# Patient Record
Sex: Female | Born: 1975 | Race: White | Hispanic: No | Marital: Married | Smoking: Former smoker
Health system: Southern US, Community
[De-identification: ages and names within clinical notes are randomized; demographics above are authoritative.]

## PROBLEM LIST (undated history)

## (undated) DIAGNOSIS — K219 Gastro-esophageal reflux disease without esophagitis: Secondary | ICD-10-CM

## (undated) DIAGNOSIS — F32A Depression, unspecified: Secondary | ICD-10-CM

## (undated) DIAGNOSIS — E669 Obesity, unspecified: Secondary | ICD-10-CM

## (undated) DIAGNOSIS — F329 Major depressive disorder, single episode, unspecified: Secondary | ICD-10-CM

## (undated) HISTORY — PX: CHOLECYSTECTOMY: SHX55

---

## 1997-11-23 ENCOUNTER — Other Ambulatory Visit: Admission: RE | Admit: 1997-11-23 | Discharge: 1997-11-23 | Payer: Self-pay | Admitting: Gynecology

## 1999-07-09 ENCOUNTER — Other Ambulatory Visit: Admission: RE | Admit: 1999-07-09 | Discharge: 1999-07-09 | Payer: Self-pay | Admitting: Obstetrics and Gynecology

## 1999-08-27 ENCOUNTER — Encounter: Admission: RE | Admit: 1999-08-27 | Discharge: 1999-11-25 | Payer: Self-pay | Admitting: Obstetrics and Gynecology

## 2000-01-08 ENCOUNTER — Inpatient Hospital Stay (HOSPITAL_COMMUNITY): Admission: AD | Admit: 2000-01-08 | Discharge: 2000-01-08 | Payer: Self-pay | Admitting: Obstetrics and Gynecology

## 2000-01-17 ENCOUNTER — Inpatient Hospital Stay (HOSPITAL_COMMUNITY): Admission: AD | Admit: 2000-01-17 | Discharge: 2000-01-17 | Payer: Self-pay | Admitting: Obstetrics and Gynecology

## 2000-01-21 ENCOUNTER — Inpatient Hospital Stay (HOSPITAL_COMMUNITY): Admission: AD | Admit: 2000-01-21 | Discharge: 2000-01-23 | Payer: Self-pay | Admitting: Obstetrics & Gynecology

## 2000-04-01 ENCOUNTER — Other Ambulatory Visit: Admission: RE | Admit: 2000-04-01 | Discharge: 2000-04-01 | Payer: Self-pay | Admitting: Obstetrics and Gynecology

## 2000-08-24 ENCOUNTER — Encounter: Payer: Self-pay | Admitting: Internal Medicine

## 2000-08-24 ENCOUNTER — Encounter: Admission: RE | Admit: 2000-08-24 | Discharge: 2000-08-24 | Payer: Self-pay | Admitting: Internal Medicine

## 2001-02-02 ENCOUNTER — Emergency Department (HOSPITAL_COMMUNITY): Admission: EM | Admit: 2001-02-02 | Discharge: 2001-02-02 | Payer: Self-pay | Admitting: Emergency Medicine

## 2001-02-02 ENCOUNTER — Encounter: Payer: Self-pay | Admitting: Emergency Medicine

## 2001-07-06 ENCOUNTER — Emergency Department (HOSPITAL_COMMUNITY): Admission: EM | Admit: 2001-07-06 | Discharge: 2001-07-06 | Payer: Self-pay | Admitting: Emergency Medicine

## 2001-07-18 ENCOUNTER — Encounter: Payer: Self-pay | Admitting: Internal Medicine

## 2001-07-18 ENCOUNTER — Encounter: Admission: RE | Admit: 2001-07-18 | Discharge: 2001-07-18 | Payer: Self-pay | Admitting: Internal Medicine

## 2001-12-20 ENCOUNTER — Inpatient Hospital Stay (HOSPITAL_COMMUNITY): Admission: EM | Admit: 2001-12-20 | Discharge: 2001-12-21 | Payer: Self-pay | Admitting: Emergency Medicine

## 2001-12-20 ENCOUNTER — Encounter: Payer: Self-pay | Admitting: Emergency Medicine

## 2007-01-10 ENCOUNTER — Inpatient Hospital Stay (HOSPITAL_COMMUNITY): Admission: AD | Admit: 2007-01-10 | Discharge: 2007-01-10 | Payer: Self-pay | Admitting: Obstetrics & Gynecology

## 2007-02-04 ENCOUNTER — Inpatient Hospital Stay (HOSPITAL_COMMUNITY): Admission: AD | Admit: 2007-02-04 | Discharge: 2007-02-05 | Payer: Self-pay | Admitting: Obstetrics

## 2007-02-23 ENCOUNTER — Encounter: Admission: RE | Admit: 2007-02-23 | Discharge: 2007-02-23 | Payer: Self-pay | Admitting: Internal Medicine

## 2007-03-09 ENCOUNTER — Ambulatory Visit (HOSPITAL_COMMUNITY): Admission: RE | Admit: 2007-03-09 | Discharge: 2007-03-10 | Payer: Self-pay | Admitting: Surgery

## 2007-03-09 ENCOUNTER — Encounter (INDEPENDENT_AMBULATORY_CARE_PROVIDER_SITE_OTHER): Payer: Self-pay | Admitting: Surgery

## 2007-04-24 ENCOUNTER — Emergency Department (HOSPITAL_COMMUNITY): Admission: EM | Admit: 2007-04-24 | Discharge: 2007-04-24 | Payer: Self-pay | Admitting: Emergency Medicine

## 2010-09-09 NOTE — Op Note (Signed)
Bethany Alvarado, Bethany Alvarado             ACCOUNT NO.:  [id number]   MEDICAL RECORD NO.:  [id number]          PATIENT TYPE:  OIB   LOCATION:  1417                         FACILITY:  [facility]   PHYSICIAN:  Wilmon Arms. Corliss Skains, M.D. 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   POSTOPERATIVE DIAGNOSIS:  Symptomatic cholelithiasis and chronic  calculus cholecystitis.   PROCEDURE PERFORMED:  Laparoscopic cholecystectomy with intraoperative  cholangiogram.   SURGEON:  Wilmon Arms. Tsuei, M.D.   ANESTHESIA:  General endotracheal.   INDICATIONS:  The patient is a 35 year old female who is 1 month  postpartum.  She had a single attack of severe right upper quadrant  pain, nausea and vomiting.  The pain was located in the epigastrium and  right upper quadrant radiating through her back and right shoulder.  White count was normal.  Liver function tests were normal.  Ultrasound  showed cholelithiasis but no sign of cholecystitis.  She had positive  HIDA scan in 2003 but for some reason did not have surgery at that time.   DESCRIPTION OF PROCEDURE:  The patient brought to the operating room,  placed in supine position on the operating table.  After an adequate  level of general anesthesia was obtained, the patient's abdomen was  prepped with Betadine and draped in sterile fashion.  Time-out was taken  to assure proper patient, proper procedure.  The area below her  umbilicus infiltrated with 0.25% Marcaine.  The transverse incision was  made below the umbilicus.  Dissection was carried down to the fascia  which was opened vertically.  The peritoneal cava was bluntly entered.  Stay sutures of Vicryl was placed around the fascial opening.  The  Hasson cannula was inserted and secured with stay suture.  Pneumoperitoneum is obtained by  insufflating CO2 maintaining maximal  pressure of 15 mmHg.  The laparoscope was inserted.  The patient was  positioned un reverse Trendelenburg tilted to her left.  The gallbladder  did not appear to be inflamed and had virtually no adhesions.  A 10 mm  port was placed in subxiphoid position.  Two 5 mm ports placed in the  right upper quadrant.  The gallbladder was grasped the clamp and  elevated over the edge of the liver.  The peritoneum around the hilum of  the gallbladder was opened.  A fairly large cystic duct was  circumferentially dissected.  This was seen clearly entering the  gallbladder.  This was ligated with a clip distally.  A small opening  was created on the cystic duct.  A Cook cholangiogram catheter was then  inserted through a stab incision threaded into the cystic duct.  A  cholangiogram was obtained.  This showed good flow proximally and  distally in the biliary tree with no sign of obstruction.  There did  seem to be a little bit of contrast leaking around the clip itself.  However, contrast flowed easily through the biliary tree down into the  duodenum.  The catheter was then removed.  The duct was ligated with  clips and divided.  The cystic artery was ligated with clips and  divided.  An additional posterior branch was ligated and divided.  Cautery was used to remove the gallbladder from the liver bed.  The  gallbladder was placed in EndoCatch sac.  We reinspected the gallbladder  fossa for hemostasis.  This was obtained with thorough cautery.  The  gallbladder was then removed through the umbilical port site.  The stay  sutures used to close umbilical fascia.  Irrigation was suctioned out of  the right pericolic gutter.  The ports were removed  under direct vision while releasing pneumoperitoneum.  4-0 Monocryl was  used to close the skin incisions.  Steri-Strips and clean dressings were  applied.  The patient was extubated and brought to recovery in stable   condition.  All sponge, instrument and needle counts correct.      Wilmon Arms. Tsuei, M.D.  Electronically Signed     MKT/MEDQ  D:  03/09/2007  T:  03/10/2007  Job:  [id number]   cc:   Theressa Millard, M.D.  Fax: [phone]

## 2010-09-12 NOTE — Discharge Summary (Signed)
   NAME:  Bethany Alvarado, Bethany Alvarado                       ACCOUNT NO.:  [id number]   MEDICAL RECORD NO.:  [id number]                   PATIENT TYPE:  INP   LOCATION:  3023                                 FACILITY:  [facility]   PHYSICIAN:  Winn Jock. Earl Gala, M.D.              DATE OF BIRTH:  Aug 08, 1975   DATE OF ADMISSION:  12/19/2001  DATE OF DISCHARGE:  12/21/2001                                 DISCHARGE SUMMARY   ADMISSION DIAGNOSIS:  Lymphocytic meningitis, rule out [facility]  spotted fever.   DISCHARGE DIAGNOSES:  1. Aseptic meningitis, ? etiology (doubt [facility] spotted fever).  2. Hypokalemia.   HISTORY OF PRESENT ILLNESS:  The patient is a 35 year old white female who  has been seen in the office about five days prior to admission with headache  and fever.  At that time, she had been empirically placed on doxycycline.  She improved but then worsened and came to the emergency room with obviously  severe headache and somewhat stiff neck.   HOSPITAL COURSE:  In the emergency room, the patient underwent a spinal tap  which revealed slightly cloudy fluid with approximately 356 cells/cc,  initially seemed to have more neutrophils than others, but on later tube,  more lymphocytes seemed to be apparent.  CSF glucose was normal, and total  protein was elevated at 90.  Gram stain was negative.  The patient was  initially treated with Rocephin in addition to the doxycycline which was  continued.  She was treated with Vicodin for her headache.   She was seen in consultation by infectious disease who thought [facility] spotted fever was not likely given the fact that the patient  worsened while she was on doxycycline.  They did not think this was a  partially treated bacterial meningitis, and they did not believe that a  specific diagnosis would be forthcoming.   She continue to improve, and her headaches had pretty much resolved, and she  was able to be discharged in improved  condition off all antibiotics except  doxycycline. She was given strict instructions to call if her symptoms  should worsen in any way.   ACTIVITY:  As tolerated.   CONCLUSION:  Improved.   DISCHARGE MEDICATIONS:  1. Doxycycline 100 mg b.i.d. x 5 days.  2. Tylenol p.r.n.   FOLLOW UP:  She will call to make an appointment to see me in approximately  one month in the office.                                                Winn Jock. Earl Gala, M.D.   JCO/MEDQ  D:  12/22/2001  T:  12/24/2001  Job:  [id number]

## 2010-11-22 ENCOUNTER — Emergency Department (HOSPITAL_COMMUNITY)
Admission: EM | Admit: 2010-11-22 | Discharge: 2010-11-22 | Disposition: A | Discharge status: Home / Self Care | Payer: BC Managed Care – PPO | Attending: Emergency Medicine | Admitting: Emergency Medicine

## 2010-11-22 ENCOUNTER — Emergency Department (HOSPITAL_COMMUNITY): Payer: BC Managed Care – PPO

## 2010-11-22 DIAGNOSIS — E669 Obesity, unspecified: Secondary | ICD-10-CM | POA: Insufficient documentation

## 2010-11-22 DIAGNOSIS — N390 Urinary tract infection, site not specified: Secondary | ICD-10-CM | POA: Insufficient documentation

## 2010-11-22 DIAGNOSIS — F329 Major depressive disorder, single episode, unspecified: Secondary | ICD-10-CM | POA: Insufficient documentation

## 2010-11-22 DIAGNOSIS — R1013 Epigastric pain: Secondary | ICD-10-CM | POA: Insufficient documentation

## 2010-11-22 DIAGNOSIS — F3289 Other specified depressive episodes: Secondary | ICD-10-CM | POA: Insufficient documentation

## 2010-11-22 DIAGNOSIS — R10816 Epigastric abdominal tenderness: Secondary | ICD-10-CM | POA: Insufficient documentation

## 2010-11-22 DIAGNOSIS — K219 Gastro-esophageal reflux disease without esophagitis: Secondary | ICD-10-CM | POA: Insufficient documentation

## 2010-11-22 LAB — URINALYSIS, ROUTINE W REFLEX MICROSCOPIC
Glucose, UA: NEGATIVE mg/dL
Ketones, ur: NEGATIVE mg/dL
Protein, ur: NEGATIVE mg/dL
pH: 8 (ref 5.0–8.0)

## 2010-11-22 LAB — URINE MICROSCOPIC-ADD ON

## 2010-11-22 LAB — COMPREHENSIVE METABOLIC PANEL
ALT: 15 U/L (ref 0–35)
AST: 15 U/L (ref 0–37)
Albumin: 3.4 g/dL — ABNORMAL LOW (ref 3.5–5.2)
CO2: 21 mEq/L (ref 19–32)
Chloride: 101 mEq/L (ref 96–112)
GFR calc non Af Amer: >60 mL/min (ref >60)
Sodium: 132 mEq/L — ABNORMAL LOW (ref 135–145)
Total Bilirubin: 0.4 mg/dL (ref 0.3–1.2)

## 2010-11-22 LAB — DIFFERENTIAL
Basophils Absolute: 0.1 10*3/uL (ref 0.0–0.1)
Basophils Relative: 1 % (ref 0–1)
Eosinophils Absolute: 0.1 10*3/uL (ref 0.0–0.7)
Neutrophils Relative %: 65 % (ref 43–77)

## 2010-11-22 LAB — CBC
Platelets: 346 10*3/uL (ref 150–400)
RBC: 4.64 MIL/uL (ref 3.87–5.11)
WBC: 8 10*3/uL (ref 4.0–10.5)

## 2011-01-30 LAB — DIFFERENTIAL
Lymphocytes Relative: 32
Lymphs Abs: 2.6
Monocytes Relative: 7
Neutro Abs: 4.6
Neutrophils Relative %: 57

## 2011-01-30 LAB — URINALYSIS, ROUTINE W REFLEX MICROSCOPIC
Bilirubin Urine: NEGATIVE
Glucose, UA: NEGATIVE
Hgb urine dipstick: NEGATIVE
Nitrite: NEGATIVE
Urobilinogen, UA: 0.2
pH: 6

## 2011-01-30 LAB — CBC
RBC: 4.03
WBC: 8

## 2011-01-30 LAB — GC/CHLAMYDIA PROBE AMP, GENITAL: Chlamydia, DNA Probe: NEGATIVE

## 2011-01-30 LAB — WET PREP, GENITAL
Clue Cells Wet Prep HPF POC: NONE SEEN
Yeast Wet Prep HPF POC: NONE SEEN

## 2011-02-03 LAB — CBC
HCT: 36.1
Platelets: 333
RDW: 13.2

## 2011-02-03 LAB — COMPREHENSIVE METABOLIC PANEL
Albumin: 3.2 — ABNORMAL LOW
Alkaline Phosphatase: 59
BUN: 7
Potassium: 3.9
Sodium: 133 — ABNORMAL LOW
Total Protein: 6.4

## 2011-02-03 LAB — DIFFERENTIAL
Basophils Relative: 0
Lymphs Abs: 1.9
Monocytes Absolute: 0.9
Monocytes Relative: 10
Neutro Abs: 5.4

## 2011-02-05 LAB — CBC
HCT: 35.8 — ABNORMAL LOW
HCT: 40.4
Hemoglobin: 12.5
Hemoglobin: 14.1
MCHC: 34.8
MCHC: 34.9
MCV: 88.3
MCV: 89.5
Platelets: 243
Platelets: 272
RBC: 4
RBC: 4.57
RDW: 13.8
RDW: 14.1 — ABNORMAL HIGH
WBC: 12.4 — ABNORMAL HIGH
WBC: 13.6 — ABNORMAL HIGH

## 2011-02-05 LAB — RPR: RPR Ser Ql: NONREACTIVE

## 2011-03-05 ENCOUNTER — Other Ambulatory Visit: Payer: Self-pay | Admitting: Internal Medicine

## 2011-03-05 DIAGNOSIS — M7989 Other specified soft tissue disorders: Secondary | ICD-10-CM

## 2011-03-06 ENCOUNTER — Ambulatory Visit
Admission: RE | Admit: 2011-03-06 | Discharge: 2011-03-06 | Disposition: A | Discharge status: Home / Self Care | Payer: BC Managed Care – PPO | Source: Ambulatory Visit | Attending: Internal Medicine | Admitting: Internal Medicine

## 2011-03-06 DIAGNOSIS — M7989 Other specified soft tissue disorders: Secondary | ICD-10-CM

## 2011-08-31 ENCOUNTER — Emergency Department (HOSPITAL_COMMUNITY): Payer: 59

## 2011-08-31 ENCOUNTER — Emergency Department (HOSPITAL_COMMUNITY)
Admission: EM | Admit: 2011-08-31 | Discharge: 2011-08-31 | Disposition: A | Discharge status: Home / Self Care | Payer: 59 | Attending: Emergency Medicine | Admitting: Emergency Medicine

## 2011-08-31 ENCOUNTER — Encounter (HOSPITAL_COMMUNITY): Payer: Self-pay | Admitting: *Deleted

## 2011-08-31 DIAGNOSIS — R109 Unspecified abdominal pain: Secondary | ICD-10-CM | POA: Insufficient documentation

## 2011-08-31 DIAGNOSIS — F3289 Other specified depressive episodes: Secondary | ICD-10-CM | POA: Insufficient documentation

## 2011-08-31 DIAGNOSIS — N390 Urinary tract infection, site not specified: Secondary | ICD-10-CM | POA: Insufficient documentation

## 2011-08-31 DIAGNOSIS — K529 Noninfective gastroenteritis and colitis, unspecified: Secondary | ICD-10-CM

## 2011-08-31 DIAGNOSIS — K219 Gastro-esophageal reflux disease without esophagitis: Secondary | ICD-10-CM | POA: Insufficient documentation

## 2011-08-31 DIAGNOSIS — Z79899 Other long term (current) drug therapy: Secondary | ICD-10-CM | POA: Insufficient documentation

## 2011-08-31 DIAGNOSIS — R112 Nausea with vomiting, unspecified: Secondary | ICD-10-CM | POA: Insufficient documentation

## 2011-08-31 DIAGNOSIS — K5289 Other specified noninfective gastroenteritis and colitis: Secondary | ICD-10-CM | POA: Insufficient documentation

## 2011-08-31 DIAGNOSIS — F329 Major depressive disorder, single episode, unspecified: Secondary | ICD-10-CM | POA: Insufficient documentation

## 2011-08-31 DIAGNOSIS — R197 Diarrhea, unspecified: Secondary | ICD-10-CM | POA: Insufficient documentation

## 2011-08-31 HISTORY — DX: Depression, unspecified: F32.A

## 2011-08-31 HISTORY — DX: Gastro-esophageal reflux disease without esophagitis: K21.9

## 2011-08-31 HISTORY — DX: Major depressive disorder, single episode, unspecified: F32.9

## 2011-08-31 LAB — URINALYSIS, ROUTINE W REFLEX MICROSCOPIC
Glucose, UA: NEGATIVE mg/dL
Ketones, ur: 15 mg/dL — AB
pH: 5.5 (ref 5.0–8.0)

## 2011-08-31 LAB — COMPREHENSIVE METABOLIC PANEL
AST: 19 U/L (ref 0–37)
Albumin: 3.5 g/dL (ref 3.5–5.2)
Chloride: 106 mEq/L (ref 96–112)
Creatinine, Ser: 0.79 mg/dL (ref 0.50–1.10)
Total Bilirubin: 0.4 mg/dL (ref 0.3–1.2)

## 2011-08-31 LAB — DIFFERENTIAL
Basophils Absolute: 0 10*3/uL (ref 0.0–0.1)
Basophils Relative: 0 % (ref 0–1)
Monocytes Absolute: 1.4 10*3/uL — ABNORMAL HIGH (ref 0.1–1.0)
Neutro Abs: 19.3 10*3/uL — ABNORMAL HIGH (ref 1.7–7.7)
Neutrophils Relative %: 86 % — ABNORMAL HIGH (ref 43–77)

## 2011-08-31 LAB — PREGNANCY, URINE: Preg Test, Ur: NEGATIVE

## 2011-08-31 LAB — CBC
HCT: 38.7 % (ref 36.0–46.0)
MCHC: 33.3 g/dL (ref 30.0–36.0)
Platelets: 292 10*3/uL (ref 150–400)
RDW: 14.9 % (ref 11.5–15.5)

## 2011-08-31 LAB — URINE MICROSCOPIC-ADD ON

## 2011-08-31 MED ORDER — MORPHINE SULFATE 4 MG/ML IJ SOLN
4.0000 mg | Freq: Once | INTRAMUSCULAR | Status: AC
  Administered 2011-08-31: 4 mg via INTRAVENOUS
  Filled 2011-08-31: qty 1

## 2011-08-31 MED ORDER — ONDANSETRON HCL 4 MG/2ML IJ SOLN
4.0000 mg | Freq: Once | INTRAMUSCULAR | Status: AC
  Administered 2011-08-31: 4 mg via INTRAVENOUS
  Filled 2011-08-31: qty 2

## 2011-08-31 MED ORDER — IOHEXOL 300 MG/ML  SOLN
110.0000 mL | Freq: Once | INTRAMUSCULAR | Status: AC | PRN
  Administered 2011-08-31: 110 mL via INTRAVENOUS

## 2011-08-31 MED ORDER — ONDANSETRON HCL 4 MG/2ML IJ SOLN
INTRAMUSCULAR | Status: AC
  Filled 2011-08-31: qty 2

## 2011-08-31 MED ORDER — CIPROFLOXACIN HCL 500 MG PO TABS: 500.0000 mg | ORAL_TABLET | Freq: Two times a day (BID) | ORAL | Status: AC

## 2011-08-31 MED ORDER — ONDANSETRON 4 MG PO TBDP: 4.0000 mg | ORAL_TABLET | Freq: Three times a day (TID) | ORAL | Status: AC | PRN

## 2011-08-31 MED ORDER — HYDROCODONE-ACETAMINOPHEN 5-500 MG PO TABS: 1.0000 | ORAL_TABLET | Freq: Four times a day (QID) | ORAL | Status: AC | PRN

## 2011-08-31 NOTE — ED Notes (Signed)
Pt undressed into gown, shirt, pj pants, and slippers, no other valuables noted

## 2011-08-31 NOTE — ED Provider Notes (Signed)
History     CSN: [id number]  Arrival date & time 08/31/11  1414   First MD Initiated Contact with Patient 08/31/11 1510      Chief Complaint  Patient presents with  . Abdominal Pain    (Consider location/radiation/quality/duration/timing/severity/associated sxs/prior treatment) HPI  Patient presents to the ED with complaint of acute onset of right upper and epigastric abdominal pain. The pain started at 11am, with multiple vomiting episodes. The patient went home early and then began to have simultaneous vomiting and diarrhea episodes with pain.She would get mild relief after vomiting or passing loose stool. She no longer has a gallbladder as it was surgically removed. She still has an appendix and denies frequent episodes of abdominal pain. This morning around 8am she had [employer] Mc griddle sandwich and oatmeal.  Patients pain is much less severe now that she is in the ED then it was before. Her pain and nausea are now a 3/10.  Past Medical History  Diagnosis Date  . Depression   . GERD (gastroesophageal reflux disease)     Past Surgical History  Procedure Date  . Cholecystectomy     No family history on file.  History  Substance Use Topics  . Smoking status: Never Smoker   . Smokeless tobacco: Not on file  . Alcohol Use: No    OB History    Grav Para Term Preterm Abortions TAB SAB Ect Mult Living                  Review of Systems   HEENT: denies blurry vision or change in hearing PULMONARY: Denies difficulty breathing and SOB CARDIAC: denies chest pain or heart palpitations MUSCULOSKELETAL:  denies being unable to ambulate ABDOMEN AL: denies RLQ pain GU: denies loss of bowel or urinary control NEURO: denies numbness and tingling in extremities   Allergies  Review of patient's allergies indicates no known allergies.  Home Medications   Current Outpatient Rx  Name Route Sig Dispense Refill  . BUPROPION HCL ER (SR) 150 MG PO TB12 Oral Take 450 mg by  mouth daily.    Marland Kitchen FLUOXETINE HCL 40 MG PO CAPS Oral Take 40 mg by mouth daily.    Marland Kitchen OVER THE COUNTER MEDICATION Oral Take 1 tablet by mouth daily. Antacid    . CIPROFLOXACIN HCL 500 MG PO TABS Oral Take 1 tablet (500 mg total) by mouth 2 (two) times daily. 14 tablet 0  . HYDROCODONE-ACETAMINOPHEN 5-500 MG PO TABS Oral Take 1 tablet by mouth every 6 (six) hours as needed for pain. 15 tablet 0  . ONDANSETRON 4 MG PO TBDP Oral Take 1 tablet (4 mg total) by mouth every 8 (eight) hours as needed for nausea. 20 tablet 0    BP 119/60  Pulse 91  Temp(Src) 97.1 F (36.2 C) (Oral)  Resp 14  SpO2 100%  LMP 08/29/2011  Physical Exam  Nursing note and vitals reviewed. Constitutional: She appears well-developed and well-nourished. No distress.  HENT:  Head: Normocephalic and atraumatic.  Eyes: Pupils are equal, round, and reactive to light.  Neck: Normal range of motion. Neck supple.  Cardiovascular: Normal rate and regular rhythm.   Pulmonary/Chest: Effort normal.  Abdominal: Soft. She exhibits no distension and no mass. There is tenderness (RUQ and epigastric tenderness). There is no rebound and no guarding.  Neurological: She is alert.  Skin: Skin is warm and dry.    ED Course  Procedures (including critical care time)  Labs Reviewed  CBC -  Abnormal; Notable for the following:    WBC 22.5 (*)    All other components within normal limits  DIFFERENTIAL - Abnormal; Notable for the following:    Neutrophils Relative 86 (*)    Neutro Abs 19.3 (*)    Lymphocytes Relative 7 (*)    Monocytes Absolute 1.4 (*)    All other components within normal limits  COMPREHENSIVE METABOLIC PANEL - Abnormal; Notable for the following:    CO2 17 (*)    All other components within normal limits  URINALYSIS, ROUTINE W REFLEX MICROSCOPIC - Abnormal; Notable for the following:    Color, Urine AMBER (*) BIOCHEMICALS MAY BE AFFECTED BY COLOR   APPearance CLOUDY (*)    Hgb urine dipstick MODERATE (*)     Bilirubin Urine SMALL (*)    Ketones, ur 15 (*)    Protein, ur 30 (*)    Leukocytes, UA SMALL (*)    All other components within normal limits  URINE MICROSCOPIC-ADD ON - Abnormal; Notable for the following:    Squamous Epithelial / LPF MANY (*)    Casts HYALINE CASTS (*)    All other components within normal limits  LIPASE, BLOOD  PREGNANCY, URINE   Ct Abdomen Pelvis W Contrast  08/31/2011  *RADIOLOGY REPORT*  Clinical Data: Abdominal pain and vomiting.  Diarrhea. Leukocytosis.  CT ABDOMEN AND PELVIS WITH CONTRAST  Technique:  Multidetector CT imaging of the abdomen and pelvis was performed following the standard protocol during bolus administration of intravenous contrast.  Contrast: OMNIPAQUE IOHEXOL 300 MG/ML  SOLN  Comparison: None.  Findings: Tiny low density lesion in the posterior right liver is compatible with hepatic cyst.  Liver measures 23.6 cm and cranial caudal length, enlarged.  Spleen is unremarkable.  The stomach, duodenum, pancreas, and adrenal glands have normal imaging features.  The gallbladder is surgically absent.  No focal abnormalities seen in either kidney.  No abdominal aortic aneurysm.  No free fluid or lymphadenopathy in the abdomen.  The abdominal bowel loops have normal imaging features.  Imaging through the pelvis shows no free intraperitoneal fluid. There is no pelvic sidewall lymphadenopathy.  Bladder is unremarkable.  IUD is visualized in the uterus.  No adnexal mass.  Colon is unremarkable without appreciable diverticular change. There is no colonic diverticulitis.  The terminal ileum is normal. The appendix is normal.  Probable bone island seen in the left pubis. Bone windows reveal no worrisome lytic or sclerotic osseous lesions.  IMPRESSION: No acute findings in the abdomen or pelvis explain the patient's history of abdominal pain with nausea vomiting.  Specifically, there is no evidence for bowel obstruction, bowel wall thickening, or perienteric/pericolonic  edema/inflammation.  Hepatomegaly.  Original Report Authenticated By: ERIC A. MANSELL, M.D.   Dg Abd Acute W/chest  08/31/2011  *RADIOLOGY REPORT*  Clinical Data: Nausea, vomiting and diarrhea.  Fever, chills diaphoresis.  ACUTE ABDOMEN SERIES (ABDOMEN 2 VIEW & CHEST 1 VIEW)  Comparison: 11/22/2010.  Findings: Frontal view of the chest shows midline trachea and normal heart size.  Lungs are clear.  No pleural fluid.  Two views of the abdomen show a relative paucity of gas in the abdomen, with scattered air-fluid levels in the right abdomen.  Gas is likely seen within the left colon. Intrauterine contraceptive device is seen in the midline.  IMPRESSION: Bowel gas pattern is nonspecific.  Difficult to exclude partial small bowel obstruction.  Original Report Authenticated By: Reyes Ivan, M.D.     1. UTI (lower urinary  tract infection)   2. Gastroenteritis       MDM  7:48 PM, patient has remained symptoms free since receiving medications in the ED. I have discussed with her the imaging results. I believe the patient may have ate something that upset her stomach. Her white count was notably elevated. But hte CT is negative and she is still pain free on evaluation.  I will treat the patients small UTI, prescribe pain medication and nausea medications.  Pt has been advised of the symptoms that warrant their return to the ED. Patient has voiced understanding and has agreed to follow-up with the PCP or specialist.         Dorthula Matas, PA 08/31/11 2131

## 2011-08-31 NOTE — Discharge Instructions (Signed)
Clear Liquid Diet The clear liquid dietconsists of foods that are liquid or will become liquid at room temperature.You should be able to see through the liquid and beverages. Examples of foods allowed on a clear liquid diet include fruit juice, broth or bouillon, gelatin, or frozen ice pops. The purpose of this diet is to provide necessary fluid, electrolytes such as sodium and potassium, and energy to keep the body functioning during times when you are not able to consume a regular diet.A clear liquid diet should not be continued for long periods of time as it is not nutritionally adequate.  REASONS FOR USING A CLEAR LIQUID DIET  In sudden onset (acute) conditions for a patient before or after surgery.   As the first step in oral feeding.   For fluid and electrolyte replacement in diarrheal diseases.   As a diet before certain medical tests are performed.  ADEQUACY The clear liquid diet is adequate only in ascorbic acid, according to the Recommended Dietary Allowances of the [employer]. CHOOSING FOODS Breads and Starches  Allowed:  None are allowed.   Avoid: All are avoided.  Vegetables  Allowed:  Strained tomato or vegetable juice.   Avoid: Any others.  Fruit  Allowed:  Strained fruit juices and fruit drinks. Include 1 serving of citrus or vitamin C-enriched fruit juice daily.   Avoid: Any others.  Meat and Meat Substitutes  Allowed:  None are allowed.   Avoid: All are avoided.  Milk  Allowed:  None are allowed.   Avoid: All are avoided.  Soups and Combination Foods  Allowed:  Clear bouillon, broth, or strained broth-based soups.   Avoid: Any others.  Desserts and Sweets  Allowed:  Sugar, honey. High protein gelatin. Flavored gelatin, ices, or frozen ice pops that do not contain milk.   Avoid: Any others.  Fats and Oils  Allowed:  None are allowed.   Avoid: All are avoided.  Beverages  Allowed: Cereal beverages, coffee (regular or  decaffeinated), tea, or soda at the discretion of your caregiver.   Avoid: Any others.  Condiments  Allowed:  Iodized salt.   Avoid: Any others, including pepper.  Supplements  Allowed:  Liquid nutrition beverages.   Avoid: Any others that contain lactose or fiber.  SAMPLE MEAL PLAN Breakfast  4 oz (120 mL) strained orange juice.    to 1 cup (125 to 250 mL) gelatin (plain or fortified).   1 cup (250 mL) beverage (coffee or tea).   Sugar, if desired.  Midmorning Snack   cup (125 mL) gelatin (plain or fortified).  Lunch  1 cup (250 mL) broth or consomm.   4 oz (120 mL) strained grapefruit juice.    cup (125 mL) gelatin (plain or fortified).   1 cup (250 mL) beverage (coffee or tea).   Sugar, if desired.  Midafternoon Snack   cup (125 mL) fruit ice.    cup (125 mL) strained fruit juice.  Dinner  1 cup (250 mL) broth or consomm.    cup (125 mL) cranberry juice.    cup (125 mL) flavored gelatin (plain or fortified).   1 cup (250 mL) beverage (coffee or tea).   Sugar, if desired.  Evening Snack  4 oz (120 mL) strained apple juice (vitamin C-fortified).    cup (125 mL) flavored gelatin (plain or fortified).  Document Released: 04/13/2005 Document Revised: 04/02/2011 Document Reviewed: 07/11/2010 [facility] Patient Information 2012 [place], [place].Urinary Tract Infection Infections of the urinary tract can start in several  places. A bladder infection (cystitis), a kidney infection (pyelonephritis), and a prostate infection (prostatitis) are different types of urinary tract infections (UTIs). They usually get better if treated with medicines (antibiotics) that kill germs. Take all the medicine until it is gone. You or your child may feel better in a few days, but TAKE ALL MEDICINE or the infection may not respond and may become more difficult to treat. HOME CARE INSTRUCTIONS   Drink enough water and fluids to keep the urine clear or pale yellow. Cranberry  juice is especially recommended, in addition to large amounts of water.   Avoid caffeine, tea, and carbonated beverages. They tend to irritate the bladder.   Alcohol may irritate the prostate.   Only take over-the-counter or prescription medicines for pain, discomfort, or fever as directed by your caregiver.  To prevent further infections:  Empty the bladder often. Avoid holding urine for long periods of time.   After a bowel movement, women should cleanse from front to back. Use each tissue only once.   Empty the bladder before and after sexual intercourse.  FINDING OUT THE RESULTS OF YOUR TEST Not all test results are available during your visit. If your or your child's test results are not back during the visit, make an appointment with your caregiver to find out the results. Do not assume everything is normal if you have not heard from your caregiver or the medical facility. It is important for you to follow up on all test results. SEEK MEDICAL CARE IF:   There is back pain.   Your baby is older than 3 months with a rectal temperature of 100.5 F (38.1 C) or higher for more than 1 day.   Your or your child's problems (symptoms) are no better in 3 days. Return sooner if you or your child is getting worse.  SEEK IMMEDIATE MEDICAL CARE IF:   There is severe back pain or lower abdominal pain.   You or your child develops chills.   You have a fever.   Your baby is older than 3 months with a rectal temperature of 102 F (38.9 C) or higher.   Your baby is 68 months old or younger with a rectal temperature of 100.4 F (38 C) or higher.   There is nausea or vomiting.   There is continued burning or discomfort with urination.  MAKE SURE YOU:   Understand these instructions.   Will watch your condition.   Will get help right away if you are not doing well or get worse.  Document Released: 01/21/2005 Document Revised: 04/02/2011 Document Reviewed: 08/26/2006 [facility] Patient  Information 2012 [place], [place].Viral Gastroenteritis Viral gastroenteritis is also known as stomach flu. This condition affects the stomach and intestinal tract. It can cause sudden diarrhea and vomiting. The illness typically lasts 3 to 8 days. Most people develop an immune response that eventually gets rid of the virus. While this natural response develops, the virus can make you quite ill. CAUSES  Many different viruses can cause gastroenteritis, such as rotavirus or noroviruses. You can catch one of these viruses by consuming contaminated food or water. You may also catch a virus by sharing utensils or other personal items with an infected person or by touching a contaminated surface. SYMPTOMS  The most common symptoms are diarrhea and vomiting. These problems can cause a severe loss of body fluids (dehydration) and a body salt (electrolyte) imbalance. Other symptoms may include:  Fever.   Headache.   Fatigue.  Abdominal pain.  DIAGNOSIS  Your caregiver can usually diagnose viral gastroenteritis based on your symptoms and a physical exam. A stool sample may also be taken to test for the presence of viruses or other infections. TREATMENT  This illness typically goes away on its own. Treatments are aimed at rehydration. The most serious cases of viral gastroenteritis involve vomiting so severely that you are not able to keep fluids down. In these cases, fluids must be given through an intravenous line (IV). HOME CARE INSTRUCTIONS   Drink enough fluids to keep your urine clear or pale yellow. Drink small amounts of fluids frequently and increase the amounts as tolerated.   Ask your caregiver for specific rehydration instructions.   Avoid:   Foods high in sugar.   Alcohol.   Carbonated drinks.   Tobacco.   Juice.   Caffeine drinks.   Extremely hot or cold fluids.   Fatty, greasy foods.   Too much intake of anything at one time.   Dairy products until 24 to 48 hours after  diarrhea stops.   You may consume probiotics. Probiotics are active cultures of beneficial bacteria. They may lessen the amount and number of diarrheal stools in adults. Probiotics can be found in yogurt with active cultures and in supplements.   Wash your hands well to avoid spreading the virus.   Only take over-the-counter or prescription medicines for pain, discomfort, or fever as directed by your caregiver. Do not give aspirin to children. Antidiarrheal medicines are not recommended.   Ask your caregiver if you should continue to take your regular prescribed and over-the-counter medicines.   Keep all follow-up appointments as directed by your caregiver.  SEEK IMMEDIATE MEDICAL CARE IF:   You are unable to keep fluids down.   You do not urinate at least once every 6 to 8 hours.   You develop shortness of breath.   You notice blood in your stool or vomit. This may look like coffee grounds.   You have abdominal pain that increases or is concentrated in one small area (localized).   You have persistent vomiting or diarrhea.   You have a fever.   The patient is a child younger than 3 months, and he or she has a fever.   The patient is a child older than 3 months, and he or she has a fever and persistent symptoms.   The patient is a child older than 3 months, and he or she has a fever and symptoms suddenly get worse.   The patient is a baby, and he or she has no tears when crying.  MAKE SURE YOU:   Understand these instructions.   Will watch your condition.   Will get help right away if you are not doing well or get worse.  Document Released: 04/13/2005 Document Revised: 04/02/2011 Document Reviewed: 01/28/2011 [facility] Patient Information 2012 [place], [place].

## 2011-08-31 NOTE — ED Notes (Signed)
Pt ambulated to restroom provided in room with out difficulties. Pt obtaining urine sample

## 2011-08-31 NOTE — ED Notes (Signed)
Per EMS pt from work, onset of abdominal pain around 1100, nausea/vomiting/diarrhea. Epigastric abd pain, sharp pain, pain constant. VSS. Pt cool/clammy, not orthostatic. Ate sausage/egg/cheese biscuit this am from [employer], questionable poisoning. IV 22G LHand. Zofran 4mg  IVP given in route.

## 2011-08-31 NOTE — ED Notes (Signed)
Lab are currently in room trying to obtain blood, will obtain urine sample once lab is finish

## 2011-09-01 NOTE — ED Provider Notes (Signed)
Medical screening examination/treatment/procedure(s) were performed by non-physician practitioner and as supervising physician I was immediately available for consultation/collaboration.   Carleene Cooper III, MD 09/01/11 1102

## 2012-02-01 ENCOUNTER — Emergency Department (HOSPITAL_COMMUNITY)
Admission: EM | Admit: 2012-02-01 | Discharge: 2012-02-01 | Disposition: A | Discharge status: Home / Self Care | Payer: 59 | Attending: Emergency Medicine | Admitting: Emergency Medicine

## 2012-02-01 ENCOUNTER — Encounter (HOSPITAL_COMMUNITY): Payer: Self-pay | Admitting: *Deleted

## 2012-02-01 ENCOUNTER — Emergency Department (HOSPITAL_COMMUNITY): Payer: 59

## 2012-02-01 DIAGNOSIS — Z9089 Acquired absence of other organs: Secondary | ICD-10-CM | POA: Insufficient documentation

## 2012-02-01 DIAGNOSIS — R112 Nausea with vomiting, unspecified: Secondary | ICD-10-CM | POA: Insufficient documentation

## 2012-02-01 DIAGNOSIS — D72829 Elevated white blood cell count, unspecified: Secondary | ICD-10-CM | POA: Insufficient documentation

## 2012-02-01 DIAGNOSIS — R1013 Epigastric pain: Secondary | ICD-10-CM | POA: Insufficient documentation

## 2012-02-01 DIAGNOSIS — R197 Diarrhea, unspecified: Secondary | ICD-10-CM | POA: Insufficient documentation

## 2012-02-01 DIAGNOSIS — R000 Tachycardia, unspecified: Secondary | ICD-10-CM | POA: Insufficient documentation

## 2012-02-01 LAB — HEPATIC FUNCTION PANEL
AST: 17 U/L (ref 0–37)
Alkaline Phosphatase: 70 U/L (ref 39–117)
Bilirubin, Direct: <0.1 mg/dL (ref 0.0–0.3)
Total Bilirubin: 0.4 mg/dL (ref 0.3–1.2)

## 2012-02-01 LAB — BASIC METABOLIC PANEL
BUN: 12 mg/dL (ref 6–23)
Creatinine, Ser: 0.76 mg/dL (ref 0.50–1.10)
GFR calc Af Amer: >90 mL/min (ref >90)
GFR calc non Af Amer: >90 mL/min (ref >90)
Glucose, Bld: 145 mg/dL — ABNORMAL HIGH (ref 70–99)

## 2012-02-01 LAB — CBC WITH DIFFERENTIAL/PLATELET
Basophils Relative: 0 % (ref 0–1)
Eosinophils Absolute: 0.2 10*3/uL (ref 0.0–0.7)
HCT: 42.2 % (ref 36.0–46.0)
Hemoglobin: 14.2 g/dL (ref 12.0–15.0)
MCH: 26.9 pg (ref 26.0–34.0)
MCHC: 33.6 g/dL (ref 30.0–36.0)
MCV: 79.9 fL (ref 78.0–100.0)
Monocytes Absolute: 1.7 10*3/uL — ABNORMAL HIGH (ref 0.1–1.0)
Monocytes Relative: 10 % (ref 3–12)

## 2012-02-01 MED ORDER — SODIUM CHLORIDE 0.9 % IV BOLUS (SEPSIS)
1000.0000 mL | Freq: Once | INTRAVENOUS | Status: AC
  Administered 2012-02-01: 1000 mL via INTRAVENOUS

## 2012-02-01 MED ORDER — ONDANSETRON HCL 4 MG/2ML IJ SOLN
4.0000 mg | Freq: Once | INTRAMUSCULAR | Status: AC
  Administered 2012-02-01: 4 mg via INTRAVENOUS
  Filled 2012-02-01: qty 2

## 2012-02-01 MED ORDER — HYDROMORPHONE HCL PF 1 MG/ML IJ SOLN
1.0000 mg | Freq: Once | INTRAMUSCULAR | Status: AC
  Administered 2012-02-01: 1 mg via INTRAVENOUS
  Filled 2012-02-01: qty 1

## 2012-02-01 MED ORDER — OXYCODONE-ACETAMINOPHEN 5-325 MG PO TABS: ORAL_TABLET | ORAL | Status: DC

## 2012-02-01 MED ORDER — IOHEXOL 300 MG/ML  SOLN: 20.0000 mL | INTRAMUSCULAR | Status: AC

## 2012-02-01 MED ORDER — FAMOTIDINE 20 MG PO TABS: 20.0000 mg | ORAL_TABLET | Freq: Two times a day (BID) | ORAL | Status: DC

## 2012-02-01 MED ORDER — ONDANSETRON HCL 4 MG PO TABS: 4.0000 mg | ORAL_TABLET | Freq: Four times a day (QID) | ORAL | Status: DC

## 2012-02-01 MED ORDER — IOHEXOL 300 MG/ML  SOLN
100.0000 mL | Freq: Once | INTRAMUSCULAR | Status: AC | PRN
  Administered 2012-02-01: 100 mL via INTRAVENOUS

## 2012-02-01 NOTE — ED Notes (Signed)
Pt sts developed sudden mid upper abdominal pain with nausea and vomiting.  Pt reports diarrhea.  Not diabetic.  Pt denies any urinary or vaginal symptoms

## 2012-02-01 NOTE — ED Provider Notes (Signed)
Medical screening examination/treatment/procedure(s) were conducted as a shared visit with non-physician practitioner(s) and myself.  I personally evaluated the patient during the encounter  Cheri Guppy, MD 02/01/12 1554

## 2012-02-01 NOTE — ED Provider Notes (Signed)
Medical screening examination/treatment/procedure(s) were conducted as a shared visit with non-physician practitioner(s) and myself.  I personally evaluated the patient during the encounter 36 year old, morbidly obese, female, with history of cholecystectomy, presents emergency department complaining of sudden onset of abdominal pain, with vomiting, and diarrhea.  This morning.  Her abdominal pain, is presently, resolved.  On, examination.  She is in no distress.  Her abdomen is benign.  Her laboratory tests show significant leukocytosis.  This may be due 2 gastroenteritis.  However, we will do a CAT scan of her abdomen.  For further evaluation.  Presently, she does not need pain medications or antiemetics  Cheri Guppy, MD 02/01/12 1546

## 2012-02-01 NOTE — ED Notes (Signed)
Pt c/o mid abd pain, n/v/d. Pt reports she had some diarrhea yesterday but was feeling fine this morning. Pt reports she later this morning she became extremely nauseous and stated vomiting. Pt requesting zofran.

## 2012-02-01 NOTE — ED Provider Notes (Signed)
History     CSN: [id number]  Arrival date & time 02/01/12  1234   First MD Initiated Contact with Patient 02/01/12 1408      Chief Complaint  Patient presents with  . Abdominal Pain    (Consider location/radiation/quality/duration/timing/severity/associated sxs/prior treatment) HPI Comments: Patient presents with epigastric pain that began suddenly this morning. Patient states that the pain is 8/10 without radiation or transmission. Associated symptoms include NVD. Patient states she has vomited 20 times and has have several episodes of diarrhea. Patient has had a cholecystectomy but no other abdominal surgeries. Denies fever or chills but reports diaphoresis. Denies hematemesis, hematochezia or melena. Denies CP or SOB. Denies consumption of ETOH.   The history is provided by the patient. No language interpreter was used.    Past Medical History  Diagnosis Date  . Depression   . GERD (gastroesophageal reflux disease)     Past Surgical History  Procedure Date  . Cholecystectomy     No family history on file.  History  Substance Use Topics  . Smoking status: Never Smoker   . Smokeless tobacco: Not on file  . Alcohol Use: No    OB History    Grav Para Term Preterm Abortions TAB SAB Ect Mult Living                  Review of Systems  Constitutional: Positive for diaphoresis. Negative for fever and chills.  Respiratory: Negative for shortness of breath.   Cardiovascular: Negative for chest pain.  Gastrointestinal: Positive for nausea, vomiting, abdominal pain and diarrhea. Negative for constipation and blood in stool.  Genitourinary: Negative for dysuria, urgency and frequency.    Allergies  Review of patient's allergies indicates no known allergies.  Home Medications   Current Outpatient Rx  Name Route Sig Dispense Refill  . BUPROPION HCL ER (SR) 150 MG PO TB12 Oral Take 450 mg by mouth daily.    Marland Kitchen FAMOTIDINE 20 MG PO TABS Oral Take 20 mg by mouth daily.      Marland Kitchen FLUOXETINE HCL 40 MG PO CAPS Oral Take 80 mg by mouth daily.       BP 96/61  Pulse 102  Temp 97.4 F (36.3 C) (Oral)  Resp 18  SpO2 98%  LMP 01/26/2012  Physical Exam  Nursing note and vitals reviewed. Constitutional: She appears well-developed and well-nourished. No distress.  HENT:  Head: Normocephalic and atraumatic.  Mouth/Throat: Oropharynx is clear and moist.  Eyes: Conjunctivae normal and EOM are normal.  Neck: Normal range of motion.  Cardiovascular: Regular rhythm, normal heart sounds and intact distal pulses.        Patient tachycardic on exam.   Pulmonary/Chest: Effort normal and breath sounds normal.  Abdominal: Soft. Bowel sounds are normal. She exhibits no distension and no mass. There is tenderness. There is no rebound and no guarding.       Tenderness to palpation of the epigastrium.  Neurological: She is alert.  Skin: Skin is warm and dry.    ED Course  Procedures (including critical care time)  Labs Reviewed  CBC WITH DIFFERENTIAL - Abnormal; Notable for the following:    WBC 18.2 (*)     RBC 5.28 (*)     Platelets 488 (*)     Neutro Abs 12.9 (*)     Monocytes Absolute 1.7 (*)     All other components within normal limits  GLUCOSE, CAPILLARY - Abnormal; Notable for the following:    Glucose-Capillary 166 (*)  All other components within normal limits  URINALYSIS, ROUTINE W REFLEX MICROSCOPIC  PREGNANCY, URINE  BASIC METABOLIC PANEL   No results found.   1. Abdominal pain, epigastric   2. Leukocytosis       MDM  Patient presented with sudden acute epigastric abdominal pain, 8/10, with NVD. Patient give dilaudid and Zofran with improvement. CBC: remarkable for leukocytosis and left shift. BMP: remarkable for hyperglycemia. Lipase and hepatic function: unremarkable. CT abdomen pelvis w contrast: pending. Patient care signed out Christa See, [place].        Pixie Casino, PA-C 02/01/12 1527  Pixie Casino, PA-C 02/01/12 1528

## 2012-02-01 NOTE — ED Provider Notes (Signed)
Sign out from PA [place] at shift change. Plan is to discharge this CT abdomen pelvis shows no abnormalities. Patient examined at bedside resting comfortably reports mild epigastric pain with no active nausea or vomiting. Abdominal exam is benign with mild tenderness to palpation of the epigastrium with no peritoneal signs.   CT shows no acute abnormalities and patient is tolerating by mouth at this point. I will discharge her with symptomatic care she has good outpatient followup with her primary care Dr. Earl Gala.  Pt verbalized understanding and agrees with care plan. Outpatient follow-up and return precautions given.    .ct   New Prescriptions   FAMOTIDINE (PEPCID) 20 MG TABLET    Take 1 tablet (20 mg total) by mouth 2 (two) times daily.   ONDANSETRON (ZOFRAN) 4 MG TABLET    Take 1 tablet (4 mg total) by mouth every 6 (six) hours.   OXYCODONE-ACETAMINOPHEN (PERCOCET/ROXICET) 5-325 MG PER TABLET    1 to 2 tabs PO q6hrs  PRN for pain    Wynetta Emery, PA-C 02/01/12 1839

## 2012-08-15 ENCOUNTER — Encounter (HOSPITAL_COMMUNITY): Payer: Self-pay | Admitting: Emergency Medicine

## 2012-08-15 ENCOUNTER — Emergency Department (HOSPITAL_COMMUNITY)
Admission: EM | Admit: 2012-08-15 | Discharge: 2012-08-15 | Disposition: A | Discharge status: Home / Self Care | Payer: 59 | Attending: Emergency Medicine | Admitting: Emergency Medicine

## 2012-08-15 ENCOUNTER — Emergency Department (HOSPITAL_COMMUNITY): Payer: 59

## 2012-08-15 DIAGNOSIS — R1013 Epigastric pain: Secondary | ICD-10-CM | POA: Insufficient documentation

## 2012-08-15 DIAGNOSIS — R197 Diarrhea, unspecified: Secondary | ICD-10-CM | POA: Insufficient documentation

## 2012-08-15 DIAGNOSIS — Z8719 Personal history of other diseases of the digestive system: Secondary | ICD-10-CM | POA: Insufficient documentation

## 2012-08-15 DIAGNOSIS — E669 Obesity, unspecified: Secondary | ICD-10-CM | POA: Insufficient documentation

## 2012-08-15 DIAGNOSIS — R52 Pain, unspecified: Secondary | ICD-10-CM | POA: Insufficient documentation

## 2012-08-15 DIAGNOSIS — Z79899 Other long term (current) drug therapy: Secondary | ICD-10-CM | POA: Insufficient documentation

## 2012-08-15 DIAGNOSIS — Z9089 Acquired absence of other organs: Secondary | ICD-10-CM | POA: Insufficient documentation

## 2012-08-15 DIAGNOSIS — F3289 Other specified depressive episodes: Secondary | ICD-10-CM | POA: Insufficient documentation

## 2012-08-15 DIAGNOSIS — F329 Major depressive disorder, single episode, unspecified: Secondary | ICD-10-CM | POA: Insufficient documentation

## 2012-08-15 DIAGNOSIS — K529 Noninfective gastroenteritis and colitis, unspecified: Secondary | ICD-10-CM

## 2012-08-15 DIAGNOSIS — K219 Gastro-esophageal reflux disease without esophagitis: Secondary | ICD-10-CM | POA: Insufficient documentation

## 2012-08-15 DIAGNOSIS — R112 Nausea with vomiting, unspecified: Secondary | ICD-10-CM

## 2012-08-15 DIAGNOSIS — Z3202 Encounter for pregnancy test, result negative: Secondary | ICD-10-CM | POA: Insufficient documentation

## 2012-08-15 HISTORY — DX: Obesity, unspecified: E66.9

## 2012-08-15 LAB — COMPREHENSIVE METABOLIC PANEL
BUN: 10 mg/dL (ref 6–23)
CO2: 21 mEq/L (ref 19–32)
Chloride: 103 mEq/L (ref 96–112)
Creatinine, Ser: 0.86 mg/dL (ref 0.50–1.10)
GFR calc Af Amer: >90 mL/min (ref >90)
GFR calc non Af Amer: 87 mL/min — ABNORMAL LOW (ref >90)
Total Bilirubin: 0.3 mg/dL (ref 0.3–1.2)

## 2012-08-15 LAB — CBC WITH DIFFERENTIAL/PLATELET
Eosinophils Relative: 2 % (ref 0–5)
HCT: 32.9 % — ABNORMAL LOW (ref 36.0–46.0)
Hemoglobin: 10.5 g/dL — ABNORMAL LOW (ref 12.0–15.0)
Lymphocytes Relative: 16 % (ref 12–46)
MCHC: 31.9 g/dL (ref 30.0–36.0)
MCV: 71.7 fL — ABNORMAL LOW (ref 78.0–100.0)
Monocytes Absolute: 0.9 10*3/uL (ref 0.1–1.0)
Monocytes Relative: 9 % (ref 3–12)
Neutro Abs: 7.6 10*3/uL (ref 1.7–7.7)
WBC: 10.3 10*3/uL (ref 4.0–10.5)

## 2012-08-15 LAB — URINALYSIS, ROUTINE W REFLEX MICROSCOPIC
Ketones, ur: NEGATIVE mg/dL
Nitrite: NEGATIVE
Protein, ur: NEGATIVE mg/dL
Urobilinogen, UA: 0.2 mg/dL (ref 0.0–1.0)

## 2012-08-15 LAB — LIPASE, BLOOD: Lipase: 22 U/L (ref 11–59)

## 2012-08-15 LAB — URINE MICROSCOPIC-ADD ON

## 2012-08-15 MED ORDER — SODIUM CHLORIDE 0.9 % IV BOLUS (SEPSIS)
1000.0000 mL | Freq: Once | INTRAVENOUS | Status: DC
Start: 2012-08-15 — End: 2012-08-15

## 2012-08-15 MED ORDER — MORPHINE SULFATE 4 MG/ML IJ SOLN
4.0000 mg | Freq: Once | INTRAMUSCULAR | Status: AC
  Administered 2012-08-15: 4 mg via INTRAVENOUS
  Filled 2012-08-15: qty 1

## 2012-08-15 MED ORDER — SODIUM CHLORIDE 0.9 % IV BOLUS (SEPSIS)
1000.0000 mL | Freq: Once | INTRAVENOUS | Status: AC
  Administered 2012-08-15: 1000 mL via INTRAVENOUS

## 2012-08-15 MED ORDER — FAMOTIDINE IN NACL 20-0.9 MG/50ML-% IV SOLN
20.0000 mg | Freq: Once | INTRAVENOUS | Status: AC
  Administered 2012-08-15: 20 mg via INTRAVENOUS
  Filled 2012-08-15: qty 50

## 2012-08-15 MED ORDER — ONDANSETRON HCL 4 MG PO TABS: 4.0000 mg | ORAL_TABLET | Freq: Four times a day (QID) | ORAL | Status: DC

## 2012-08-15 MED ORDER — IOHEXOL 300 MG/ML  SOLN
25.0000 mL | INTRAMUSCULAR | Status: AC
  Administered 2012-08-15: 25 mL via ORAL

## 2012-08-15 MED ORDER — ONDANSETRON HCL 4 MG/2ML IJ SOLN
4.0000 mg | Freq: Once | INTRAMUSCULAR | Status: AC
  Administered 2012-08-15: 4 mg via INTRAVENOUS
  Filled 2012-08-15: qty 2

## 2012-08-15 MED ORDER — ONDANSETRON HCL 4 MG/2ML IJ SOLN
4.0000 mg | Freq: Once | INTRAMUSCULAR | Status: AC
  Administered 2012-08-15: 4 mg via INTRAVENOUS

## 2012-08-15 MED ORDER — IOHEXOL 300 MG/ML  SOLN
100.0000 mL | Freq: Once | INTRAMUSCULAR | Status: AC | PRN
  Administered 2012-08-15: 100 mL via INTRAVENOUS

## 2012-08-15 NOTE — ED Notes (Signed)
Pt finished 1st cup of contrast.  Kelly in CT notified.

## 2012-08-15 NOTE — ED Notes (Signed)
Pt returned from CT.  No changes at the present.

## 2012-08-15 NOTE — ED Provider Notes (Signed)
History     CSN: [id number]  Arrival date & time 08/15/12  0503   First MD Initiated Contact with Patient 08/15/12 [phone]      Chief Complaint  Patient presents with  . Emesis  . Diarrhea    (Consider location/radiation/quality/duration/timing/severity/associated sxs/prior treatment) HPI Bethany Alvarado is a 37 yo morbidly obese woman with a history of remote cholecystectomy. She presents with complaints of severe epigastric pain which is diffuse but nonradiating. Pain is aching and burning. Patient says pain is reminscent of previous episode of cholecystitis. She awoke from sleep approx 2 hrs ago with this pain.   The patient has been nauseated. She has had 4 episodes of brown emesis which "smells bad and kinda smells like poop". Patient notes multiple (3-4) episodes of non-bloody, brown and watery diarrhea. She denies fever. No GU sx.   Denies any ill contacts.  Past Medical History  Diagnosis Date  . Depression   . GERD (gastroesophageal reflux disease)   . Obesity     Past Surgical History  Procedure Laterality Date  . Cholecystectomy      No family history on file.  History  Substance Use Topics  . Smoking status: Never Smoker   . Smokeless tobacco: Not on file  . Alcohol Use: No    OB History   Grav Para Term Preterm Abortions TAB SAB Ect Mult Living                  Review of Systems Gen: no weight loss, fevers, chills, night sweats Eyes: no discharge or drainage, no occular pain or visual changes Nose: no epistaxis or rhinorrhea Mouth: no dental pain, no sore throat Neck: no neck pain Lungs: no SOB, cough, wheezing CV: no chest pain, palpitations, dependent edema or orthopnea Abd: As per history of present illness, otherwise negative GU: no dysuria or gross hematuria MSK: no myalgias or arthralgias Neuro: no headache, no focal neurologic deficits Skin: no rash Psyche: negative.  Allergies  Review of patient's allergies indicates no known  allergies.  Home Medications   Current Outpatient Rx  Name  Route  Sig  Dispense  Refill  . buPROPion (WELLBUTRIN SR) 150 MG 12 hr tablet   Oral   Take 450 mg by mouth daily.         Marland Kitchen FLUoxetine (PROZAC) 40 MG capsule   Oral   Take 80 mg by mouth daily.          Marland Kitchen omeprazole (PRILOSEC OTC) 20 MG tablet   Oral   Take 20 mg by mouth daily.           BP 161/87  Pulse 112  Temp(Src) 98.9 F (37.2 C) (Oral)  Resp 14  SpO2 99%  LMP 08/14/2012  Physical Exam Gen: well developed and well nourished appearing Head: NCAT Eyes: PERL, EOMI Nose: no epistaixis or rhinorrhea Mouth/throat: mucosa is moist and pink Neck: supple, no stridor Lungs: CTA B, no wheezing, rhonchi or rales Heart: Rapid rate with pulse of 108-112, no murmur, extremities appear well perfused Abd: soft, obese, tender over the midline epigastrium nondistended Back: no ttp, no cva ttp Skin: no rash, wnl Neuro: CN ii-xii grossly intact, no focal deficits Psyche; normal affect,  calm and cooperative.   ED Course  Procedures (including critical care time)  Labs reviewed. See EMR. Notable for negative urine pregnancy, normal CMP and lipase level. Mild, microcytic anemia.   Patient managed symptomatically with IVF, Zosyn and Pepcid. In light of report of  feculent emesis and history of abdominal surgery, CT abd/pelvis ordered to exclude SBO. Case signed out to Dr. Effie Shy at change of shift who will follow up on CT results and determine the most appropriate disposition.      MDM  See above        Brandt Loosen, MD 08/16/12 [phone]

## 2012-08-15 NOTE — ED Notes (Signed)
PT. REPORTS EMESIS WITH DIARRHEA ONSET THIS MORNING WITH CHILLS AND MID ABDOMINAL CRAMPING.

## 2012-08-15 NOTE — ED Notes (Signed)
Pt states that her nausea has improved and she is "comfortable at the present".

## 2012-08-15 NOTE — ED Provider Notes (Signed)
Bethany Alvarado is a 37 y.o. female Was here for evaluation of nausea, vomiting, and diarrhea. Illnesses short-term. The patient feels better with treatment has been administered in the ED, today.   Results for orders placed during the hospital encounter of 08/15/12  CBC WITH DIFFERENTIAL      Result Value Range   WBC 10.3  4.0 - 10.5 K/uL   RBC 4.59  3.87 - 5.11 MIL/uL   Hemoglobin 10.5 (*) 12.0 - 15.0 g/dL   HCT 16.1 (*) 09.6 - 04.5 %   MCV 71.7 (*) 78.0 - 100.0 fL   MCH 22.9 (*) 26.0 - 34.0 pg   MCHC 31.9  30.0 - 36.0 g/dL   RDW 40.9 (*) 81.1 - 91.4 %   Platelets 375  150 - 400 K/uL   Neutrophils Relative 73  43 - 77 %   Neutro Abs 7.6  1.7 - 7.7 K/uL   Lymphocytes Relative 16  12 - 46 %   Lymphs Abs 1.7  0.7 - 4.0 K/uL   Monocytes Relative 9  3 - 12 %   Monocytes Absolute 0.9  0.1 - 1.0 K/uL   Eosinophils Relative 2  0 - 5 %   Eosinophils Absolute 0.2  0.0 - 0.7 K/uL   Basophils Relative 0  0 - 1 %   Basophils Absolute 0.0  0.0 - 0.1 K/uL  COMPREHENSIVE METABOLIC PANEL      Result Value Range   Sodium 136  135 - 145 mEq/L   Potassium 3.7  3.5 - 5.1 mEq/L   Chloride 103  96 - 112 mEq/L   CO2 21  19 - 32 mEq/L   Glucose, Bld 125 (*) 70 - 99 mg/dL   BUN 10  6 - 23 mg/dL   Creatinine, Ser 7.82  0.50 - 1.10 mg/dL   Calcium 8.7  8.4 - 95.6 mg/dL   Total Protein 7.4  6.0 - 8.3 g/dL   Albumin 3.7  3.5 - 5.2 g/dL   AST 14  0 - 37 U/L   ALT 15  0 - 35 U/L   Alkaline Phosphatase 63  39 - 117 U/L   Total Bilirubin 0.3  0.3 - 1.2 mg/dL   GFR calc non Af Amer 87 (*) >90 mL/min   GFR calc Af Amer >90  >90 mL/min  LIPASE, BLOOD      Result Value Range   Lipase 22  11 - 59 U/L  URINALYSIS, ROUTINE W REFLEX MICROSCOPIC      Result Value Range   Color, Urine YELLOW  YELLOW   APPearance CLOUDY (*) CLEAR   Specific Gravity, Urine 1.025  1.005 - 1.030   pH 5.5  5.0 - 8.0   Glucose, UA NEGATIVE  NEGATIVE mg/dL   Hgb urine dipstick LARGE (*) NEGATIVE   Bilirubin Urine NEGATIVE   NEGATIVE   Ketones, ur NEGATIVE  NEGATIVE mg/dL   Protein, ur NEGATIVE  NEGATIVE mg/dL   Urobilinogen, UA 0.2  0.0 - 1.0 mg/dL   Nitrite NEGATIVE  NEGATIVE   Leukocytes, UA SMALL (*) NEGATIVE  PREGNANCY, URINE      Result Value Range   Preg Test, Ur NEGATIVE  NEGATIVE  URINE MICROSCOPIC-ADD ON      Result Value Range   Squamous Epithelial / LPF RARE  RARE   WBC, UA 3-6  <3 WBC/hpf   RBC / HPF TOO NUMEROUS TO COUNT  <3 RBC/hpf   Bacteria, UA FEW (*) RARE   Crystals CA  OXALATE CRYSTALS (*) NEGATIVE  Ct Abdomen Pelvis W Contrast  08/15/2012  *RADIOLOGY REPORT*  Clinical Data: Epigastric pain.  Emesis.  Previous cholecystectomy. Right upper quadrant pain.  CT ABDOMEN AND PELVIS WITH CONTRAST  Technique:  Multidetector CT imaging of the abdomen and pelvis was performed following the standard protocol during bolus administration of intravenous contrast.  Contrast: OMNIPAQUE IOHEXOL 300 MG/ML  SOLN  Comparison: 02/01/2012  Findings: .  Lung bases are clear.  No pleural or pericardial fluid.  The liver parenchyma is normal except for a chronically present 8 mm low density in the right lobe of the liver, certainly benign.  There is been previous cholecystectomy.  The spleen is at the upper limits of normal in size.  No focal lesions.  The pancreas is normal.  The adrenal glands are normal.  The kidneys are normal.  The aorta and IVC are normal.  No retroperitoneal mass or adenopathy.  No free intraperitoneal fluid or air.  No hiatal hernia.  No abnormality of the stomach, duodenum or small intestine.  No sign of small bowel obstruction.  The colon contains some liquid material but does not show any pathologic finding.  IUD is in place within the uterus.  No adnexal lesion.  No significant bony finding. There is ordinary degenerative disc disease at L5-S1.  IMPRESSION: No abnormalities seen to explain the patient's symptoms.  No evidence of bowel obstruction or acute bowel pathology.  The patient does  appear to have liquid stool in the colon.   Original Report Authenticated By: Paulina Fusi, M.D.      Assessment: Evaluation is consistent with infectious gastroenteritis. Doubt metabolic instability, serious bacterial infection or impending vascular collapse; the patient is stable for discharge.   Plan: symptomatic treatment, and gradually advance diet. Prescription for Zofran. Followup with PCP, when necessary.  Flint Melter, MD 08/15/12 1739

## 2012-08-16 LAB — URINE CULTURE: Colony Count: 9000

## 2013-03-09 ENCOUNTER — Emergency Department (HOSPITAL_COMMUNITY)
Admission: EM | Admit: 2013-03-09 | Discharge: 2013-03-09 | Disposition: A | Discharge status: Home / Self Care | Payer: 59 | Attending: Emergency Medicine | Admitting: Emergency Medicine

## 2013-03-09 ENCOUNTER — Encounter (HOSPITAL_COMMUNITY): Payer: Self-pay | Admitting: Emergency Medicine

## 2013-03-09 ENCOUNTER — Emergency Department (HOSPITAL_COMMUNITY): Payer: 59

## 2013-03-09 DIAGNOSIS — E669 Obesity, unspecified: Secondary | ICD-10-CM | POA: Insufficient documentation

## 2013-03-09 DIAGNOSIS — Z79899 Other long term (current) drug therapy: Secondary | ICD-10-CM | POA: Insufficient documentation

## 2013-03-09 DIAGNOSIS — R110 Nausea: Secondary | ICD-10-CM | POA: Insufficient documentation

## 2013-03-09 DIAGNOSIS — R000 Tachycardia, unspecified: Secondary | ICD-10-CM | POA: Insufficient documentation

## 2013-03-09 DIAGNOSIS — R52 Pain, unspecified: Secondary | ICD-10-CM | POA: Insufficient documentation

## 2013-03-09 DIAGNOSIS — F329 Major depressive disorder, single episode, unspecified: Secondary | ICD-10-CM | POA: Insufficient documentation

## 2013-03-09 DIAGNOSIS — R197 Diarrhea, unspecified: Secondary | ICD-10-CM | POA: Insufficient documentation

## 2013-03-09 DIAGNOSIS — F3289 Other specified depressive episodes: Secondary | ICD-10-CM | POA: Insufficient documentation

## 2013-03-09 DIAGNOSIS — J159 Unspecified bacterial pneumonia: Secondary | ICD-10-CM | POA: Insufficient documentation

## 2013-03-09 DIAGNOSIS — K219 Gastro-esophageal reflux disease without esophagitis: Secondary | ICD-10-CM | POA: Insufficient documentation

## 2013-03-09 DIAGNOSIS — J189 Pneumonia, unspecified organism: Secondary | ICD-10-CM

## 2013-03-09 LAB — COMPREHENSIVE METABOLIC PANEL
Albumin: 3.5 g/dL (ref 3.5–5.2)
BUN: 6 mg/dL (ref 6–23)
Creatinine, Ser: 0.79 mg/dL (ref 0.50–1.10)
Potassium: 4.4 mEq/L (ref 3.5–5.1)
Total Protein: 7.8 g/dL (ref 6.0–8.3)

## 2013-03-09 LAB — CBC WITH DIFFERENTIAL/PLATELET
Basophils Relative: 0 % (ref 0–1)
Eosinophils Absolute: 0 10*3/uL (ref 0.0–0.7)
Hemoglobin: 10.2 g/dL — ABNORMAL LOW (ref 12.0–15.0)
MCH: 22.1 pg — ABNORMAL LOW (ref 26.0–34.0)
MCHC: 31.2 g/dL (ref 30.0–36.0)
Monocytes Relative: 11 % (ref 3–12)
Neutrophils Relative %: 79 % — ABNORMAL HIGH (ref 43–77)

## 2013-03-09 LAB — INFLUENZA PANEL BY PCR (TYPE A & B)
H1N1 flu by pcr: NOT DETECTED
Influenza A By PCR: NEGATIVE
Influenza B By PCR: NEGATIVE

## 2013-03-09 MED ORDER — AZITHROMYCIN 250 MG PO TABS: 250.0000 mg | ORAL_TABLET | Freq: Every day | ORAL | Status: DC

## 2013-03-09 MED ORDER — SODIUM CHLORIDE 0.9 % IV BOLUS (SEPSIS)
1000.0000 mL | Freq: Once | INTRAVENOUS | Status: AC
  Administered 2013-03-09: 1000 mL via INTRAVENOUS

## 2013-03-09 MED ORDER — ONDANSETRON 4 MG PO TBDP
4.0000 mg | ORAL_TABLET | Freq: Once | ORAL | Status: AC
  Administered 2013-03-09: 4 mg via ORAL
  Filled 2013-03-09: qty 1

## 2013-03-09 MED ORDER — IBUPROFEN 800 MG PO TABS
800.0000 mg | ORAL_TABLET | Freq: Once | ORAL | Status: AC
  Administered 2013-03-09: 800 mg via ORAL
  Filled 2013-03-09: qty 1

## 2013-03-09 MED ORDER — AZITHROMYCIN 250 MG PO TABS
500.0000 mg | ORAL_TABLET | Freq: Once | ORAL | Status: AC
  Administered 2013-03-09: 500 mg via ORAL
  Filled 2013-03-09: qty 2

## 2013-03-09 MED ORDER — HYDROCODONE-ACETAMINOPHEN 5-325 MG PO TABS: 1.0000 | ORAL_TABLET | ORAL | Status: AC | PRN

## 2013-03-09 NOTE — ED Provider Notes (Signed)
CSN: [id number]     Arrival date & time 03/09/13  4098 History   First MD Initiated Contact with Patient 03/09/13 [phone]     Chief Complaint  Patient presents with  . Fever  . Generalized Body Aches   (Consider location/radiation/quality/duration/timing/severity/associated sxs/prior Treatment) HPI Comments: 37 year old female presents with 2 days of generalized body aches, not feeling well, fever, dry cough and chest pain. Her son recently got over a pneumonia. She thinks he is treated with amoxicillin and doxycycline. He is improving and now has a dry cough now. Patient states that her symptoms been getting gradually worse. She had a fever of 101 yesterday. Her cough is nonproductive. She does feel some shortness of breath. She has no known lung problems. She is also having a headache. No weakness or numbness. No sore throat. She is feeling a little bit nauseous. Her husband is here with similar symptoms started last night.   Past Medical History  Diagnosis Date  . Depression   . GERD (gastroesophageal reflux disease)   . Obesity    Past Surgical History  Procedure Laterality Date  . Cholecystectomy     No family history on file. History  Substance Use Topics  . Smoking status: Never Smoker   . Smokeless tobacco: Not on file  . Alcohol Use: No   OB History   Grav Para Term Preterm Abortions TAB SAB Ect Mult Living                 Review of Systems  Constitutional: Positive for fever and chills.  HENT: Negative for sore throat.   Respiratory: Positive for cough and shortness of breath.   Cardiovascular: Positive for chest pain.  Gastrointestinal: Positive for nausea and diarrhea. Negative for vomiting and abdominal pain.  Genitourinary: Negative for dysuria.  Neurological: Positive for headaches. Negative for weakness.  All other systems reviewed and are negative.    Allergies  Review of patient's allergies indicates no known allergies.  Home Medications   Current  Outpatient Rx  Name  Route  Sig  Dispense  Refill  . FLUoxetine (PROZAC) 40 MG capsule   Oral   Take 80 mg by mouth daily.          Marland Kitchen omeprazole (PRILOSEC OTC) 20 MG tablet   Oral   Take 20 mg by mouth daily.          BP 127/89  Temp(Src) 99.7 F (37.6 C) (Oral)  Resp 20  Ht 5\' 8"  (1.727 m)  Wt 283 lb (128.368 kg)  BMI 43.04 kg/m2  SpO2 98%  LMP 03/09/2013 Physical Exam  Nursing note and vitals reviewed. Constitutional: She is oriented to person, place, and time. She appears well-developed and well-nourished.  obese  HENT:  Head: Normocephalic and atraumatic.  Right Ear: External ear normal.  Left Ear: External ear normal.  Nose: Nose normal.  Mouth/Throat: No oropharyngeal exudate.  Eyes: Pupils are equal, round, and reactive to light. Right eye exhibits no discharge. Left eye exhibits no discharge.  Cardiovascular: Regular rhythm and normal heart sounds.  Tachycardia present.   Pulmonary/Chest: Effort normal. She has decreased breath sounds in the right lower field. She has no wheezes. She exhibits tenderness.  Abdominal: Soft. There is no tenderness.  Neurological: She is alert and oriented to person, place, and time.  Skin: Skin is warm and dry.    ED Course  Procedures (including critical care time) Labs Review Labs Reviewed  CBC WITH DIFFERENTIAL - Abnormal; Notable for the  following:    WBC 13.8 (*)    Hemoglobin 10.2 (*)    HCT 32.7 (*)    MCV 70.8 (*)    MCH 22.1 (*)    RDW 17.2 (*)    Neutrophils Relative % 79 (*)    Neutro Abs 10.8 (*)    Lymphocytes Relative 10 (*)    Monocytes Absolute 1.6 (*)    All other components within normal limits  COMPREHENSIVE METABOLIC PANEL - Abnormal; Notable for the following:    Sodium 132 (*)    Glucose, Bld 101 (*)    All other components within normal limits  INFLUENZA PANEL BY PCR   Imaging Review Dg Chest 2 View  03/09/2013   CLINICAL DATA:  Fever.  Cough.  EXAM: CHEST  2 VIEW  COMPARISON:  None.   FINDINGS: Mediastinum is normal. Poor lung volumes with right lower lobe infiltrate consistent with pneumonia. Heart size normal. No pneumothorax. No acute bony abnormality.  IMPRESSION: Right lower lobe pneumonia.   Electronically Signed   By: Maisie Fus  Register   On: 03/09/2013 09:02    EKG Interpretation     Ventricular Rate:  127 PR Interval:  129 QRS Duration: 73 QT Interval:  313 QTC Calculation: 455 R Axis:   58 Text Interpretation:  Sinus tachycardia Probable left atrial enlargement Borderline T abnormalities, diffuse leads            MDM   1. Community acquired pneumonia    Patient with evidence of CAP. No hypoxia or tachypnea. Able to take po here, and HR improved. Stable for outpatient trial of abx with close PCP f/u. Has some non-specific T waves but given that she is having cough, myalgias and infectious sx, I think her chest pain is from inflammation. ACS very unlikely in this scenario. Given hx of sick contacts and atypical PE is unlikely as well. Will treat pain and give abx.    Audree Camel, MD 03/09/13 [phone]

## 2013-03-09 NOTE — ED Notes (Signed)
Patient states fever and generalized body aches starting last night, patient states family member with pnuemonia at home.

## 2014-08-30 IMAGING — CT CT ABD-PELV W/ CM
2 of 4 series · 17 of 46 positions shown, 19 images · IV contrast (APPLIED)
Comparison: 02/01/2012

CLINICAL DATA: Epigastric pain.  Emesis.  Previous cholecystectomy.
Right upper quadrant pain.

CT ABDOMEN AND PELVIS WITH CONTRAST
TECHNIQUE: Multidetector CT imaging of the abdomen and pelvis was
performed following the standard protocol during bolus
administration of intravenous contrast.
Contrast: 100mL OMNIPAQUE IOHEXOL 300 MG/ML  SOLN

[Series 2: abd/pelv with 5.0 b31f st · axial · 0.98mm/px · z∈[-530,-86]mm · 14 of 99 slices shown, 16 images]
[im 5/99  soft-tissue]
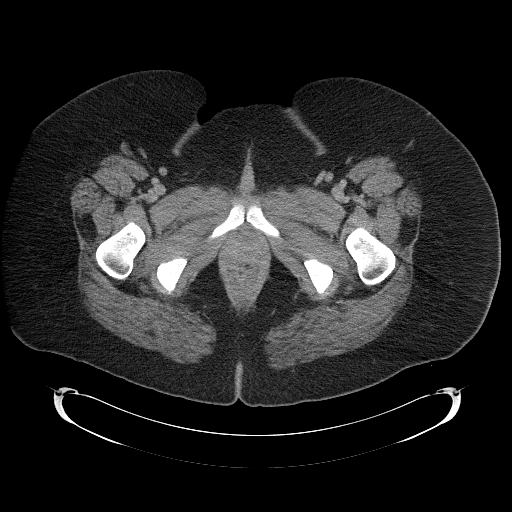
[im 5/99  bone]
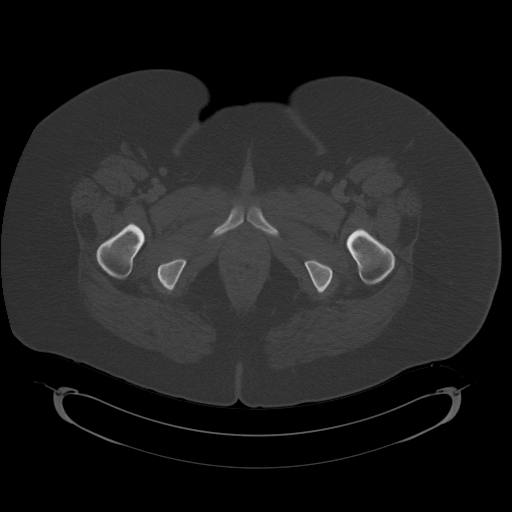
[im 14/99  soft-tissue]
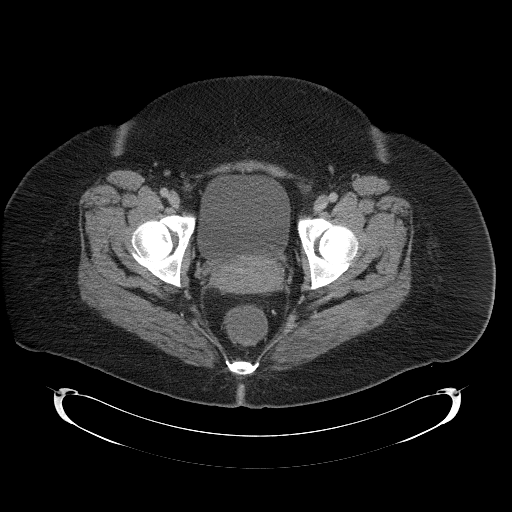
[im 18/99  soft-tissue]
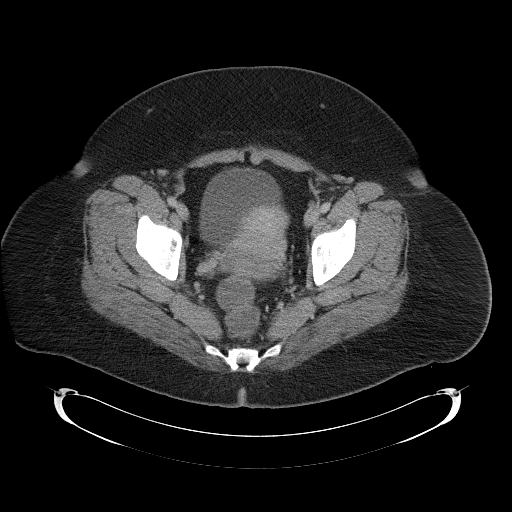
[im 27/99  soft-tissue]
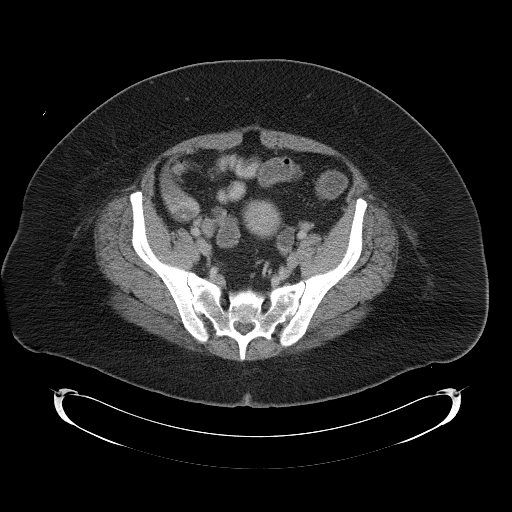
[im 32/99  soft-tissue]
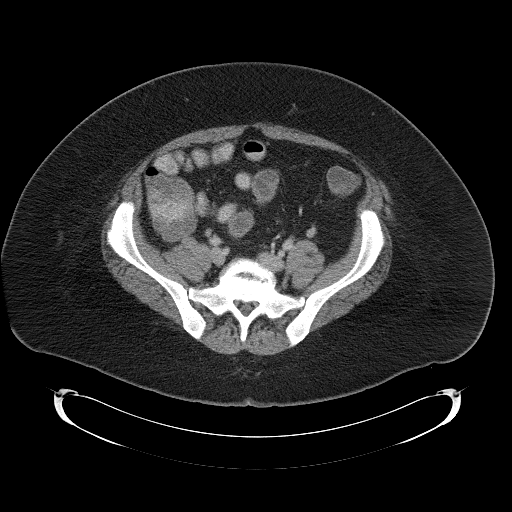
[im 41/99  soft-tissue]
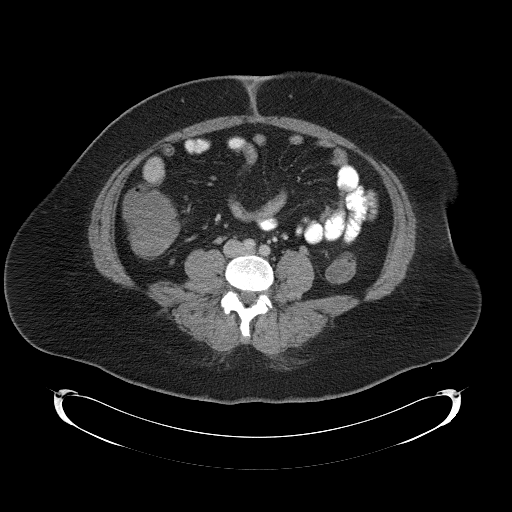
[im 45/99  soft-tissue]
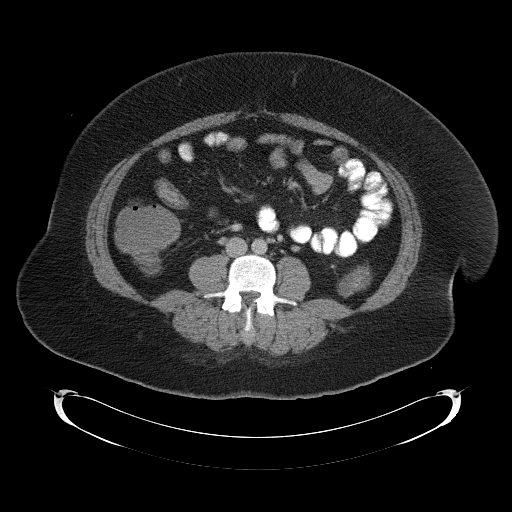
[im 54/99  soft-tissue]
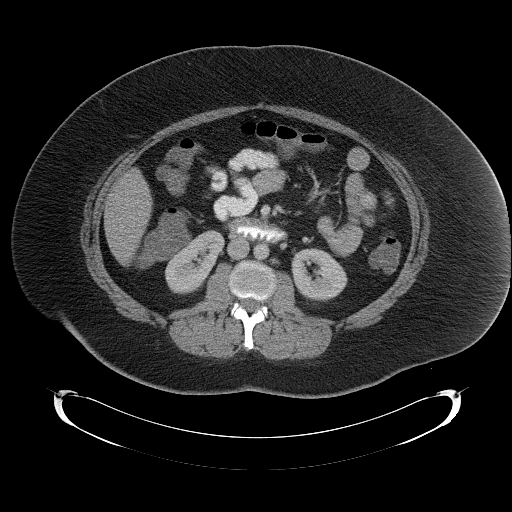
[im 58/99  soft-tissue]
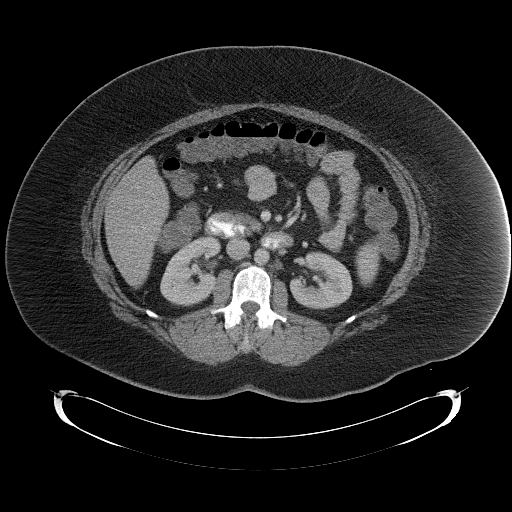
[im 58/99  bone]
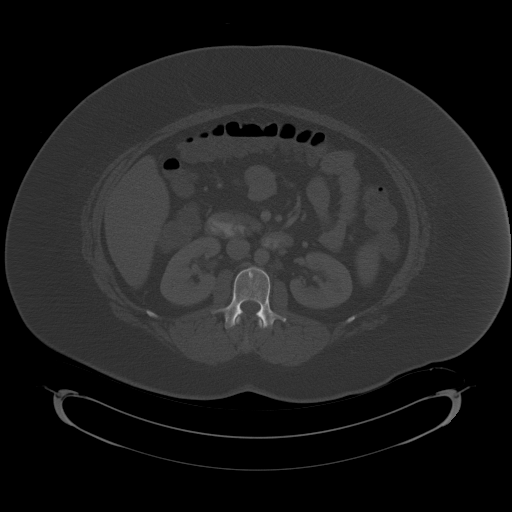
[im 67/99  soft-tissue]
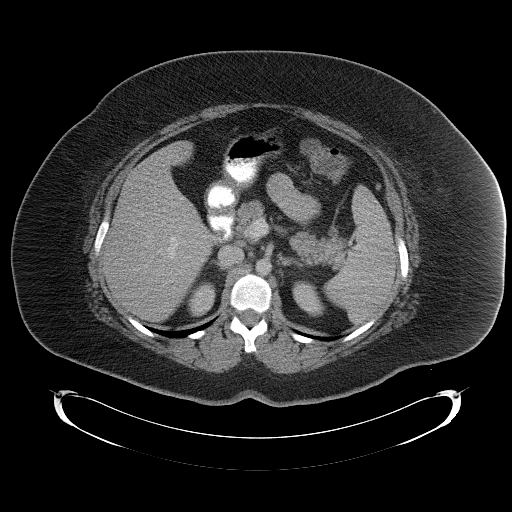
[im 72/99  soft-tissue]
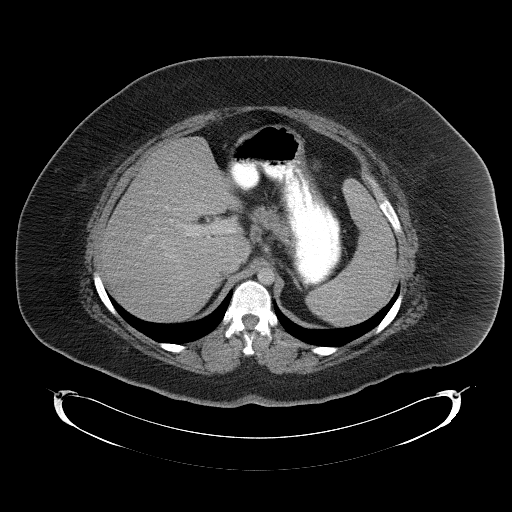
[im 81/99  soft-tissue]
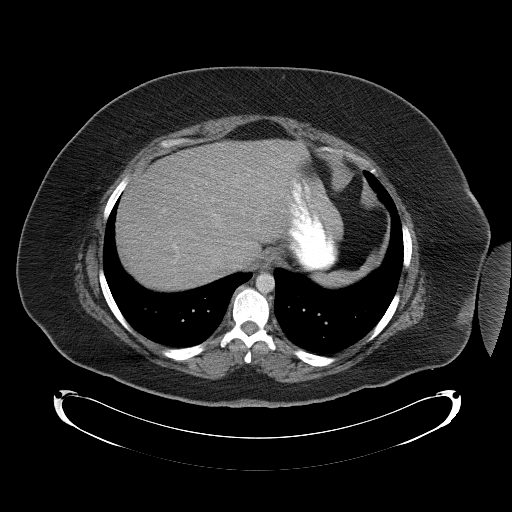
[im 85/99  soft-tissue]
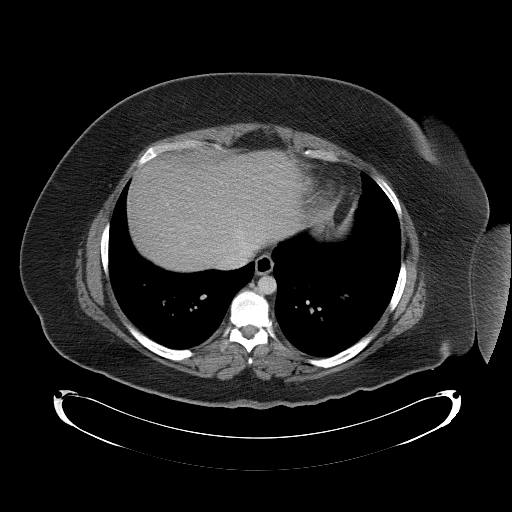
[im 94/99  soft-tissue]
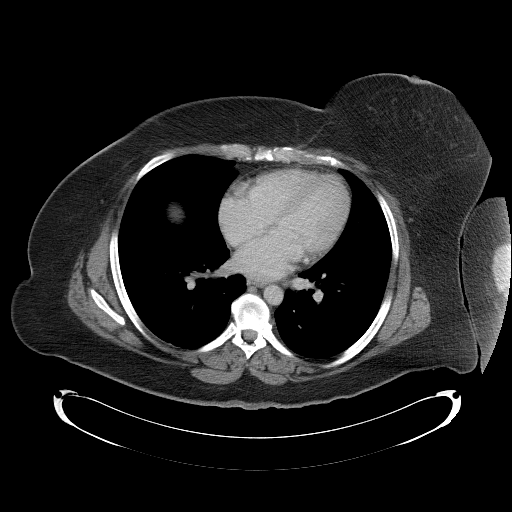

[Series 5: coronals · coronal · 0.96mm/px · 3 of 135 slices shown]
[im 45/135  soft-tissue]
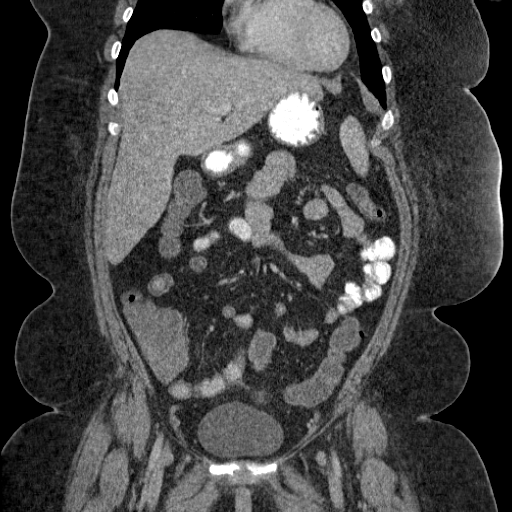
[im 60/135  soft-tissue]
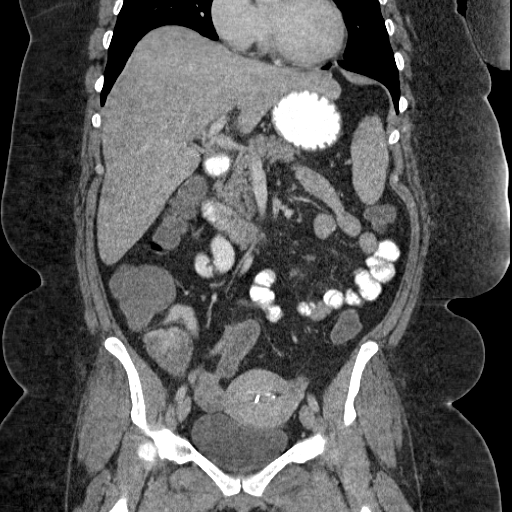
[im 75/135  soft-tissue]
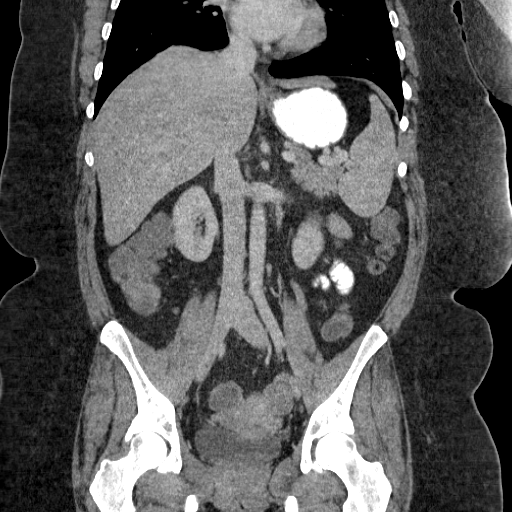

[17 of 46 positions shown; findings below may reference images not displayed]

FINDINGS: .  Lung bases are clear.  No pleural or pericardial
fluid.  The liver parenchyma is normal except for a chronically
present 8 mm low density in the right lobe of the liver, certainly
benign.  There is been previous cholecystectomy.  The spleen is at
the upper limits of normal in size.  No focal lesions.  The
pancreas is normal.  The adrenal glands are normal.  The kidneys
are normal.  The aorta and IVC are normal.  No retroperitoneal mass
or adenopathy.  No free intraperitoneal fluid or air.  No hiatal
hernia.  No abnormality of the stomach, duodenum or small
intestine.  No sign of small bowel obstruction.  The colon contains
some liquid material but does not show any pathologic finding.

IUD is in place within the uterus.  No adnexal lesion.  No
significant bony finding. There is ordinary degenerative disc
disease at L5-S1.
IMPRESSION: No abnormalities seen to explain the patient's symptoms.  No
evidence of bowel obstruction or acute bowel pathology.  The
patient does appear to have liquid stool in the colon.

## 2015-03-24 IMAGING — CR DG CHEST 2V
2 series · 2 of 2 positions shown · non-contrast
Comparison: None.

CLINICAL DATA: Fever.  Cough.

EXAM:
CHEST  2 VIEW

[w chest pa]
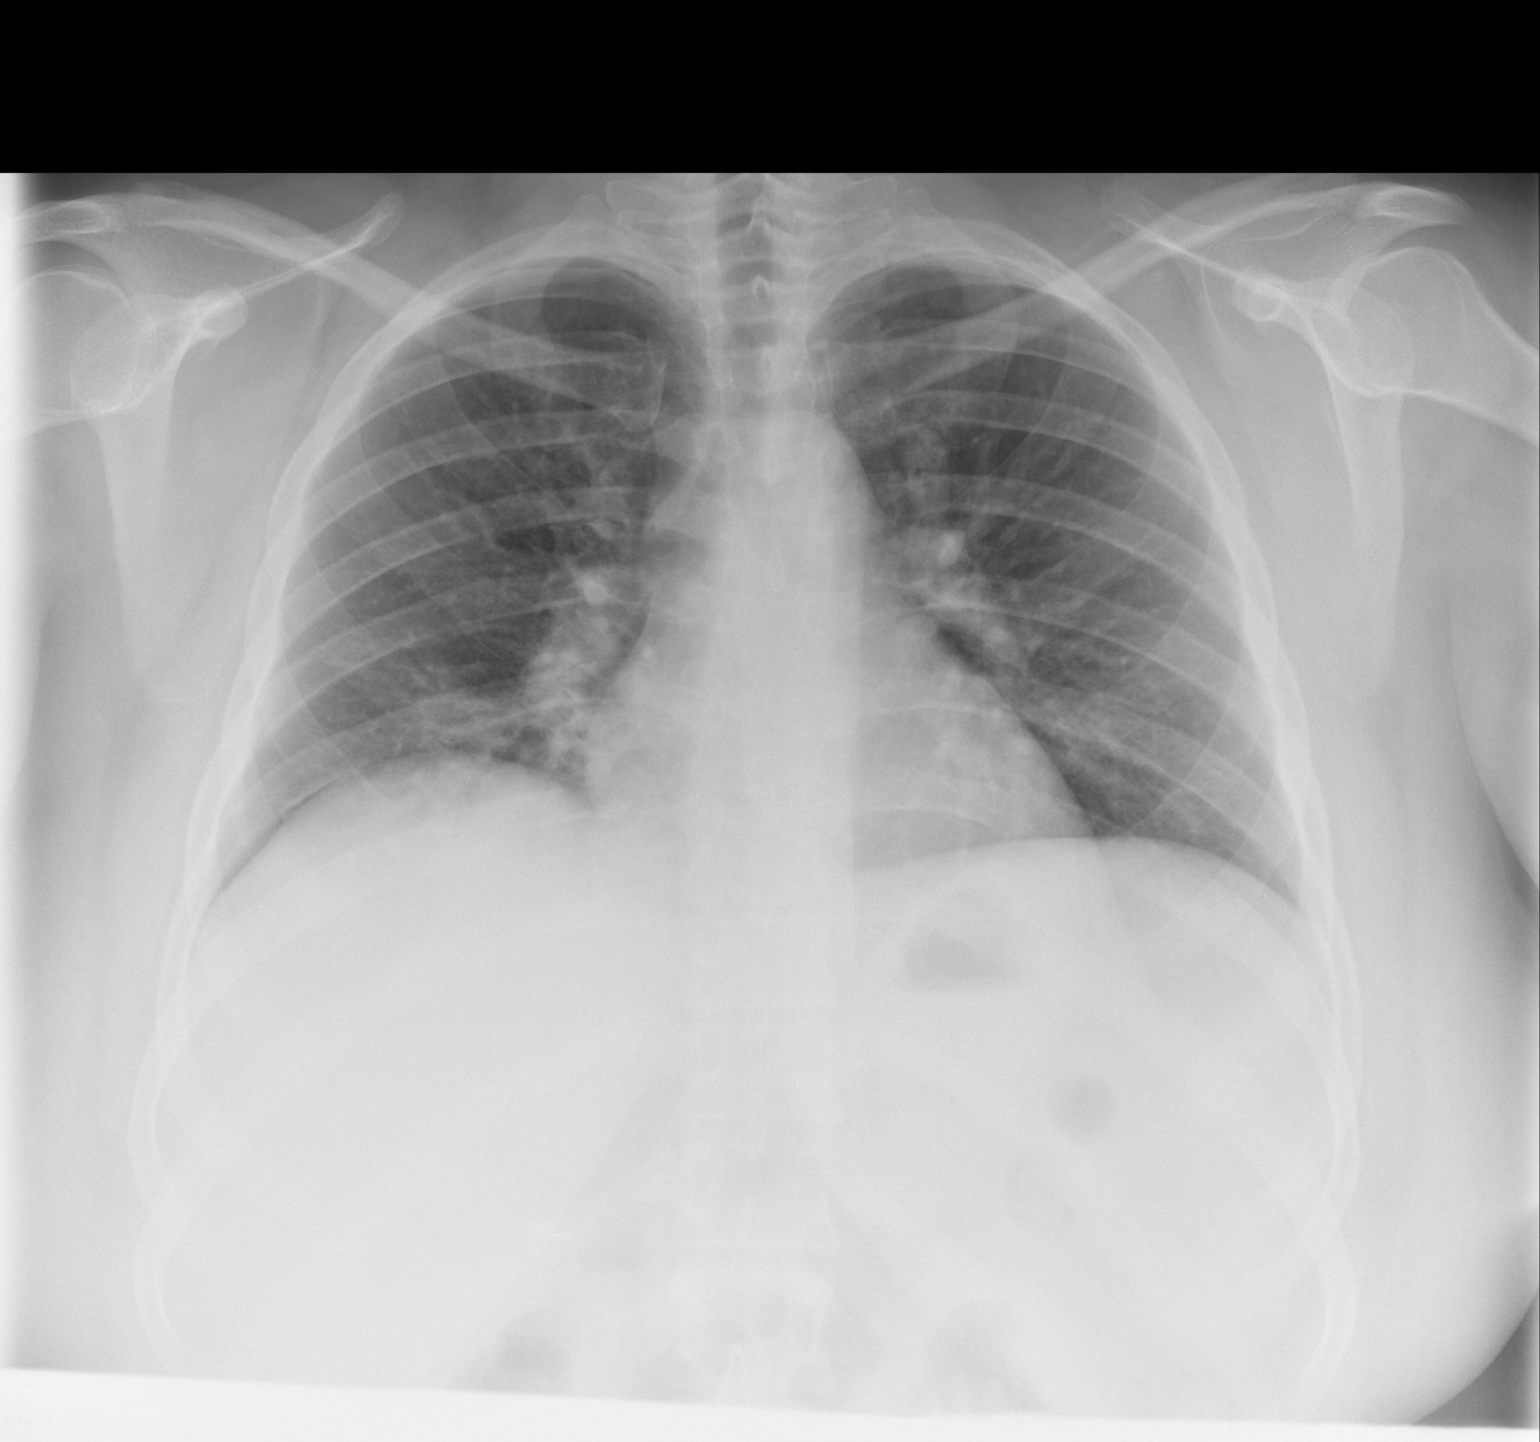

[w chest lat]
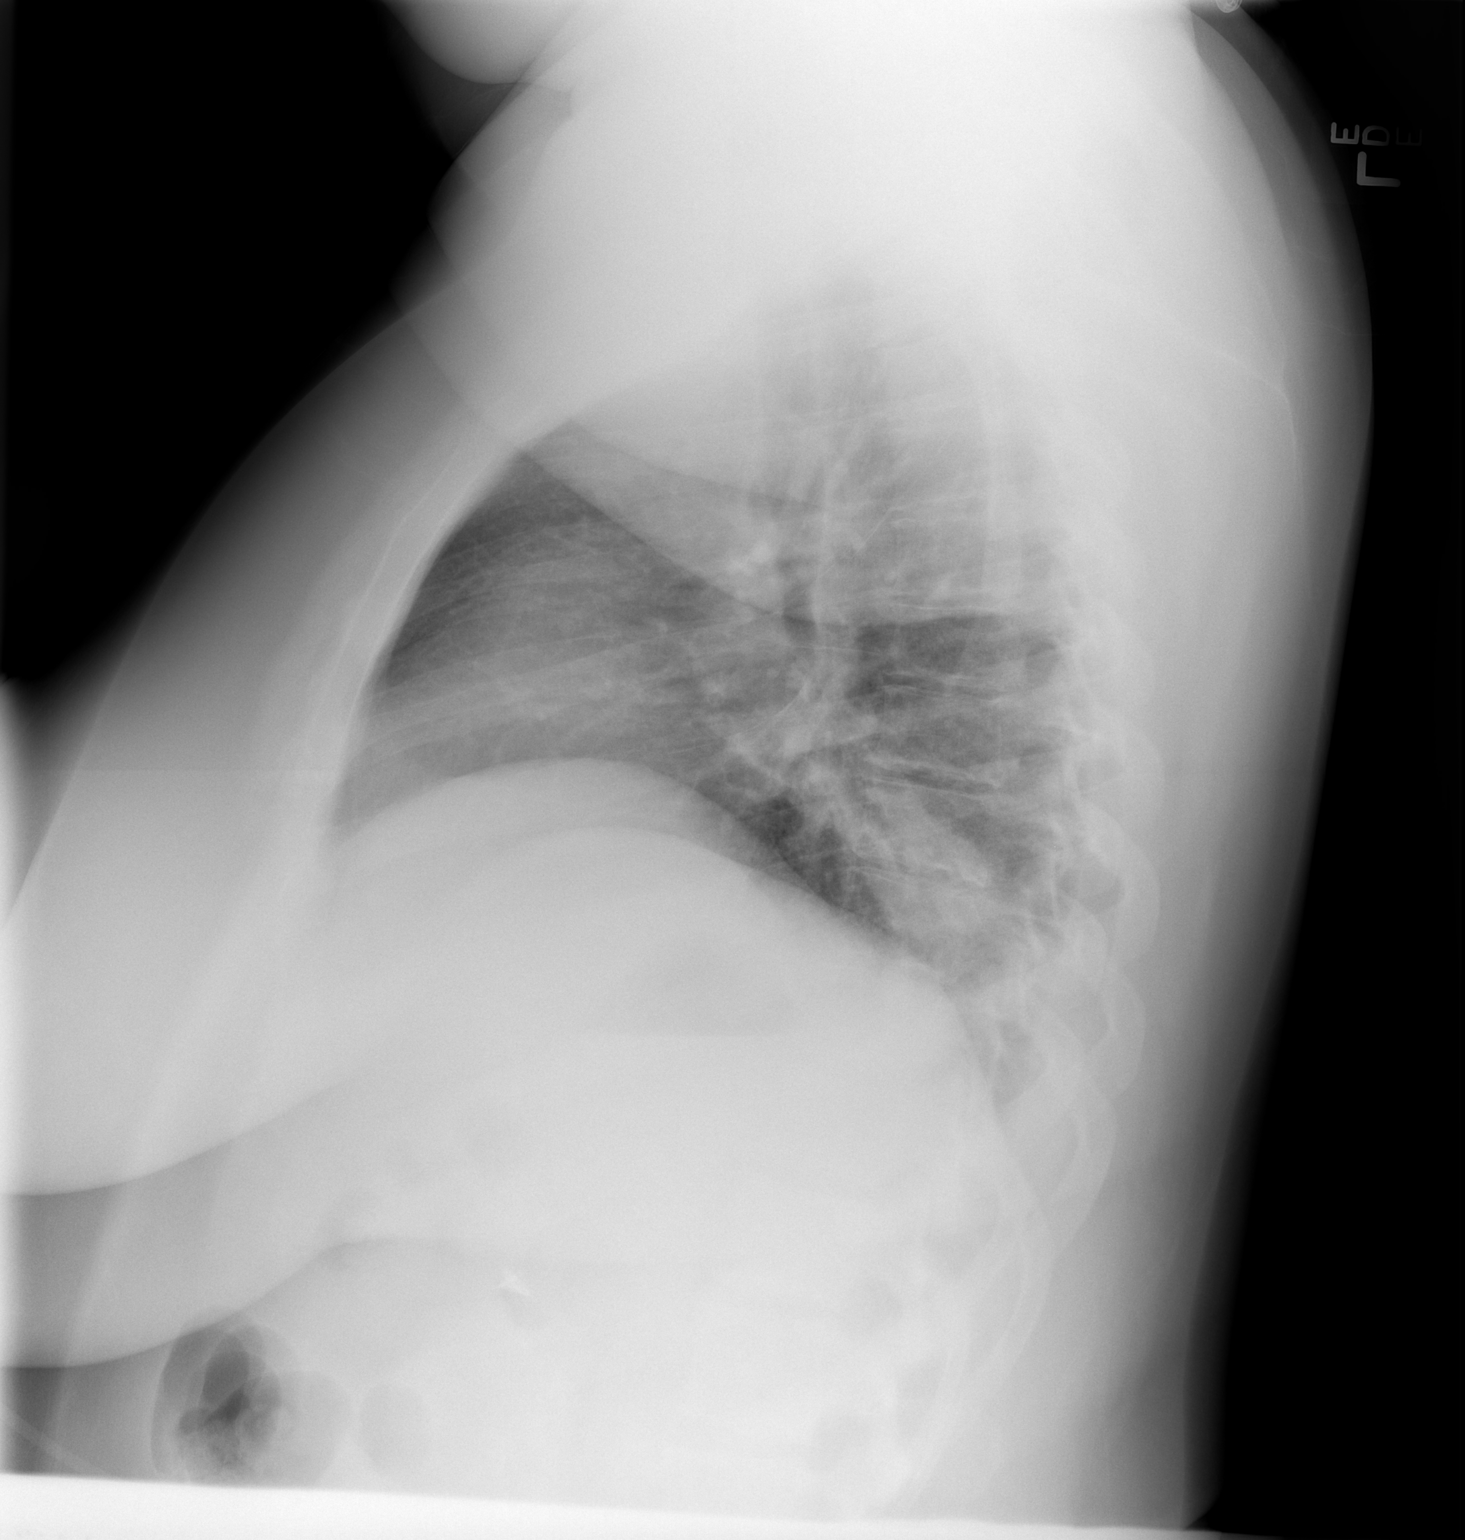

[2 of 2 positions shown; findings below may reference images not displayed]

FINDINGS: Mediastinum is normal. Poor lung volumes with right lower lobe
infiltrate consistent with pneumonia. Heart size normal. No
pneumothorax. No acute bony abnormality.
IMPRESSION: Right lower lobe pneumonia.

## 2018-11-21 ENCOUNTER — Ambulatory Visit
Admission: EM | Admit: 2018-11-21 | Discharge: 2018-11-21 | Disposition: A | Discharge status: Home / Self Care | Payer: 59 | Attending: Family Medicine | Admitting: Family Medicine

## 2018-11-21 ENCOUNTER — Other Ambulatory Visit: Payer: Self-pay

## 2018-11-21 ENCOUNTER — Encounter: Payer: Self-pay | Admitting: Emergency Medicine

## 2018-11-21 DIAGNOSIS — H6123 Impacted cerumen, bilateral: Secondary | ICD-10-CM

## 2018-11-21 NOTE — ED Triage Notes (Signed)
Pt presents to [facility] for assessment after getting water in her right ear on Tuesday.  States she has been trying to get it out since then.  States she's tried Hydrogen Peroxide, swimmers eat kit, and alcohol without relief.

## 2018-11-21 NOTE — Discharge Instructions (Signed)
Return as needed

## 2018-11-21 NOTE — ED Notes (Signed)
Patient able to ambulate independently  

## 2018-11-21 NOTE — ED Provider Notes (Signed)
[facility]    CSN: [id number] Arrival date & time: 11/21/18  1646     History   Chief Complaint Chief Complaint  Patient presents with  . Otalgia    HPI Bethany Alvarado is a 43 y.o. female.   HPI  Patient was on vacation last week.  She did a lot of swimming.  She has fullness in both ears.  She thought it might be swimmer's ear.  She tried some peroxide.  She tried swimmer's ear drops.  They still feel full.  No pain.  No runny or stuffy nose.  No fever.  No sore throat.  Slight decreased hearing   Past Medical History:  Diagnosis Date  . Depression   . GERD (gastroesophageal reflux disease)   . Obesity     There are no active problems to display for this patient.   Past Surgical History:  Procedure Laterality Date  . CHOLECYSTECTOMY      OB History   No obstetric history on file.      Home Medications    Prior to Admission medications   Medication Sig Start Date End Date Taking? Authorizing Provider  FLUoxetine (PROZAC) 40 MG capsule Take 80 mg by mouth daily.    Yes [provider]  omeprazole (PRILOSEC OTC) 20 MG tablet Take 20 mg by mouth daily.   Yes [provider]  BuPROPion HCl ER, XL, (FORFIVO XL) 450 MG TB24 Take 1 tablet by mouth daily.    [provider]  HYDROcodone-acetaminophen (NORCO) 5-325 MG per tablet Take 1 tablet by mouth every 4 (four) hours as needed. 03/09/13   Sherwood Gambler, MD  ibuprofen (ADVIL) 200 MG tablet Take 200 mg by mouth daily as needed.    [provider]    Family History History reviewed. No pertinent family history.  Social History Social History   Tobacco Use  . Smoking status: Former Research scientist (life sciences)  . Smokeless tobacco: Never Used  Substance Use Topics  . Alcohol use: No  . Drug use: No     Allergies   Patient has no known allergies.   Review of Systems Review of Systems  Constitutional: Negative for chills and fever.  HENT: Positive for hearing loss.  Negative for ear pain and sore throat.   Eyes: Negative for pain and visual disturbance.  Respiratory: Negative for cough and shortness of breath.   Cardiovascular: Negative for chest pain and palpitations.  Gastrointestinal: Negative for abdominal pain and vomiting.  Genitourinary: Negative for dysuria and hematuria.  Musculoskeletal: Negative for arthralgias and back pain.  Skin: Negative for color change and rash.  Neurological: Negative for seizures and syncope.  All other systems reviewed and are negative.    Physical Exam Triage Vital Signs ED Triage Vitals  Enc Vitals Group     BP 11/21/18 1659 (!) 159/93     Pulse Rate 11/21/18 1659 98     Resp 11/21/18 1659 18     Temp 11/21/18 1659 99.1 F (37.3 C)     Temp Source 11/21/18 1659 Oral     SpO2 11/21/18 1659 98 %     Weight --      Height --      Head Circumference --      Peak Flow --      Pain Score 11/21/18 1656 0     Pain Loc --      Pain Edu? --      Excl. in [place]? --  No data found.  Updated Vital Signs BP (!) 159/93 (BP Location: Left Wrist)   Pulse 98   Temp 99.1 F (37.3 C) (Oral)   Resp 18   LMP 11/04/2018   SpO2 98%      Physical Exam Constitutional:      General: She is not in acute distress.    Appearance: She is well-developed.  HENT:     Head: Normocephalic and atraumatic.     Right Ear: There is impacted cerumen.     Left Ear: There is impacted cerumen.  Eyes:     Conjunctiva/sclera: Conjunctivae normal.     Pupils: Pupils are equal, round, and reactive to light.  Neck:     Musculoskeletal: Normal range of motion.  Cardiovascular:     Rate and Rhythm: Normal rate.  Pulmonary:     Effort: Pulmonary effort is normal. No respiratory distress.  Abdominal:     General: There is no distension.     Palpations: Abdomen is soft.  Musculoskeletal: Normal range of motion.  Skin:    General: Skin is warm and dry.  Neurological:     Mental Status: She is alert.    Exam after curettage  and flushing reveals normal ear canals and normal eardrums  UC Treatments / Results  Labs (all labs ordered are listed, but only abnormal results are displayed) Labs Reviewed - No data to display  EKG   Radiology No results found.  Procedures Procedures (including critical care time)  Medications Ordered in UC Medications - No data to display  Initial Impression / Assessment and Plan / UC Course  I have reviewed the triage vital signs and the nursing notes.  Pertinent labs & imaging results that were available during my care of the patient were reviewed by me and considered in my medical decision making (see chart for details).      Final Clinical Impressions(s) / UC Diagnoses   Final diagnoses:  Bilateral impacted cerumen     Discharge Instructions     Return as needed    ED Prescriptions    None     Controlled Substance Prescriptions [place] Controlled Substance Registry consulted? No   Eustace MooreNelson, Stirling Orton Sue, MD 11/21/18 [phone]
